# Patient Record
Sex: Male | Born: 1975 | Race: Black or African American | Hispanic: No | Marital: Single | Smoking: Former smoker
Health system: Southern US, Community
[De-identification: ages and names within clinical notes are randomized; demographics above are authoritative.]

## PROBLEM LIST (undated history)

## (undated) DIAGNOSIS — I10 Essential (primary) hypertension: Secondary | ICD-10-CM

## (undated) DIAGNOSIS — R600 Localized edema: Secondary | ICD-10-CM

## (undated) DIAGNOSIS — I8390 Asymptomatic varicose veins of unspecified lower extremity: Secondary | ICD-10-CM

## (undated) HISTORY — DX: Localized edema: R60.0

## (undated) HISTORY — DX: Asymptomatic varicose veins of unspecified lower extremity: I83.90

## (undated) HISTORY — PX: LEG SURGERY: SHX1003

---

## 2014-03-17 ENCOUNTER — Emergency Department (HOSPITAL_BASED_OUTPATIENT_CLINIC_OR_DEPARTMENT_OTHER)
Admission: EM | Admit: 2014-03-17 | Discharge: 2014-03-17 | Disposition: A | Discharge status: Home / Self Care | Payer: BC Managed Care – PPO | Attending: Emergency Medicine | Admitting: Emergency Medicine

## 2014-03-17 ENCOUNTER — Encounter (HOSPITAL_BASED_OUTPATIENT_CLINIC_OR_DEPARTMENT_OTHER): Payer: Self-pay | Admitting: Emergency Medicine

## 2014-03-17 DIAGNOSIS — Y9289 Other specified places as the place of occurrence of the external cause: Secondary | ICD-10-CM | POA: Insufficient documentation

## 2014-03-17 DIAGNOSIS — I10 Essential (primary) hypertension: Secondary | ICD-10-CM | POA: Insufficient documentation

## 2014-03-17 DIAGNOSIS — X58XXXA Exposure to other specified factors, initial encounter: Secondary | ICD-10-CM | POA: Insufficient documentation

## 2014-03-17 DIAGNOSIS — Y9389 Activity, other specified: Secondary | ICD-10-CM | POA: Diagnosis not present

## 2014-03-17 DIAGNOSIS — T783XXA Angioneurotic edema, initial encounter: Secondary | ICD-10-CM | POA: Diagnosis not present

## 2014-03-17 DIAGNOSIS — N289 Disorder of kidney and ureter, unspecified: Secondary | ICD-10-CM | POA: Diagnosis not present

## 2014-03-17 DIAGNOSIS — T7840XA Allergy, unspecified, initial encounter: Secondary | ICD-10-CM | POA: Diagnosis present

## 2014-03-17 DIAGNOSIS — F419 Anxiety disorder, unspecified: Secondary | ICD-10-CM | POA: Diagnosis not present

## 2014-03-17 LAB — CBC WITH DIFFERENTIAL/PLATELET
BASOS ABS: 0 10*3/uL (ref 0.0–0.1)
BASOS PCT: 0 % (ref 0–1)
EOS ABS: 0.2 10*3/uL (ref 0.0–0.7)
Eosinophils Relative: 2 % (ref 0–5)
HEMATOCRIT: 42.8 % (ref 39.0–52.0)
HEMOGLOBIN: 15.4 g/dL (ref 13.0–17.0)
Lymphocytes Relative: 27 % (ref 12–46)
Lymphs Abs: 2.4 10*3/uL (ref 0.7–4.0)
MCH: 29.1 pg (ref 26.0–34.0)
MCHC: 36 g/dL (ref 30.0–36.0)
MCV: 80.8 fL (ref 78.0–100.0)
Monocytes Absolute: 0.8 10*3/uL (ref 0.1–1.0)
Monocytes Relative: 9 % (ref 3–12)
NEUTROS ABS: 5.4 10*3/uL (ref 1.7–7.7)
Neutrophils Relative %: 62 % (ref 43–77)
Platelets: 192 10*3/uL (ref 150–400)
RBC: 5.3 MIL/uL (ref 4.22–5.81)
RDW: 13.9 % (ref 11.5–15.5)
WBC: 8.8 10*3/uL (ref 4.0–10.5)

## 2014-03-17 LAB — COMPREHENSIVE METABOLIC PANEL
ALBUMIN: 3.8 g/dL (ref 3.5–5.2)
ALT: 36 U/L (ref 0–53)
AST: 29 U/L (ref 0–37)
Alkaline Phosphatase: 122 U/L — ABNORMAL HIGH (ref 39–117)
Anion gap: 15 (ref 5–15)
BUN: 12 mg/dL (ref 6–23)
CO2: 26 mEq/L (ref 19–32)
CREATININE: 1.5 mg/dL — AB (ref 0.50–1.35)
Calcium: 9.6 mg/dL (ref 8.4–10.5)
Chloride: 101 mEq/L (ref 96–112)
GFR calc Af Amer: 66 mL/min — ABNORMAL LOW (ref >90)
GFR calc non Af Amer: 57 mL/min — ABNORMAL LOW (ref >90)
Glucose, Bld: 122 mg/dL — ABNORMAL HIGH (ref 70–99)
Potassium: 3.6 mEq/L — ABNORMAL LOW (ref 3.7–5.3)
Sodium: 142 mEq/L (ref 137–147)
TOTAL PROTEIN: 8.3 g/dL (ref 6.0–8.3)
Total Bilirubin: 0.5 mg/dL (ref 0.3–1.2)

## 2014-03-17 MED ORDER — METOPROLOL TARTRATE 50 MG PO TABS
50.0000 mg | ORAL_TABLET | Freq: Once | ORAL | Status: AC
  Administered 2014-03-17: 50 mg via ORAL
  Filled 2014-03-17: qty 1

## 2014-03-17 MED ORDER — HYDROCHLOROTHIAZIDE 25 MG PO TABS: 25.0000 mg | ORAL_TABLET | Freq: Once | ORAL | Status: DC

## 2014-03-17 MED ORDER — EPINEPHRINE HCL 1 MG/ML IJ SOLN
INTRAMUSCULAR | Status: AC
  Filled 2014-03-17: qty 1

## 2014-03-17 MED ORDER — METHYLPREDNISOLONE SODIUM SUCC 125 MG IJ SOLR
125.0000 mg | Freq: Once | INTRAMUSCULAR | Status: AC
  Administered 2014-03-17: 125 mg via INTRAVENOUS

## 2014-03-17 MED ORDER — ALBUTEROL SULFATE (2.5 MG/3ML) 0.083% IN NEBU
5.0000 mg | INHALATION_SOLUTION | Freq: Once | RESPIRATORY_TRACT | Status: AC
  Administered 2014-03-17: 5 mg via RESPIRATORY_TRACT
  Filled 2014-03-17: qty 6

## 2014-03-17 MED ORDER — EPINEPHRINE 0.3 MG/0.3ML IJ SOAJ: 0.3000 mg | Freq: Once | INTRAMUSCULAR | Status: AC

## 2014-03-17 MED ORDER — HYDROCHLOROTHIAZIDE 25 MG PO TABS
25.0000 mg | ORAL_TABLET | Freq: Once | ORAL | Status: AC
  Administered 2014-03-17: 25 mg via ORAL
  Filled 2014-03-17: qty 1

## 2014-03-17 MED ORDER — PREDNISONE 50 MG PO TABS: 50.0000 mg | ORAL_TABLET | Freq: Every day | ORAL | Status: DC

## 2014-03-17 MED ORDER — DIPHENHYDRAMINE HCL 50 MG/ML IJ SOLN
50.0000 mg | Freq: Once | INTRAMUSCULAR | Status: AC
  Administered 2014-03-17: 50 mg via INTRAVENOUS

## 2014-03-17 MED ORDER — METHYLPREDNISOLONE SODIUM SUCC 125 MG IJ SOLR
INTRAMUSCULAR | Status: AC
  Administered 2014-03-17: 125 mg via INTRAVENOUS
  Filled 2014-03-17: qty 2

## 2014-03-17 MED ORDER — DIPHENHYDRAMINE HCL 50 MG/ML IJ SOLN
INTRAMUSCULAR | Status: AC
  Administered 2014-03-17: 50 mg via INTRAVENOUS
  Filled 2014-03-17: qty 1

## 2014-03-17 MED ORDER — SODIUM CHLORIDE 0.9 % IV BOLUS (SEPSIS)
1000.0000 mL | Freq: Once | INTRAVENOUS | Status: AC
  Administered 2014-03-17: 1000 mL via INTRAVENOUS

## 2014-03-17 MED ORDER — HYDROCHLOROTHIAZIDE 25 MG PO TABS: 25.0000 mg | ORAL_TABLET | Freq: Every day | ORAL | Status: DC

## 2014-03-17 MED ORDER — FAMOTIDINE IN NACL 20-0.9 MG/50ML-% IV SOLN
INTRAVENOUS | Status: AC
  Administered 2014-03-17: 20 mg via INTRAVENOUS
  Filled 2014-03-17: qty 50

## 2014-03-17 MED ORDER — FAMOTIDINE IN NACL 20-0.9 MG/50ML-% IV SOLN
20.0000 mg | Freq: Once | INTRAVENOUS | Status: AC
  Administered 2014-03-17: 20 mg via INTRAVENOUS

## 2014-03-17 MED ORDER — METOPROLOL TARTRATE 50 MG PO TABS: 50.0000 mg | ORAL_TABLET | Freq: Two times a day (BID) | ORAL | Status: DC

## 2014-03-17 NOTE — ED Notes (Signed)
Pt placed on heart monitor.

## 2014-03-17 NOTE — ED Provider Notes (Signed)
CSN: [id number]     Arrival date & time 03/17/14  1546 History   First MD Initiated Contact with Patient 03/17/14 1559     Chief Complaint  Patient presents with  . Allergic Reaction     (Consider location/radiation/quality/duration/timing/severity/associated sxs/prior Treatment) The history is provided by the patient.  Stephen Humphrey is a 38 y.o. male here with possible angio edema. That him sandwich several hours ago and then about an hour later started having lip swelling and tongue swelling. Denies new food or new shampoos or new drugs. The patient is not currently on any medicines. No hx of angioedema.    No past medical history on file. No past surgical history on file. No family history on file. History  Substance Use Topics  . Smoking status: Never Smoker   . Smokeless tobacco: Not on file  . Alcohol Use: Not on file    Review of Systems  HENT:       Lip swelling   All other systems reviewed and are negative.     Allergies  Review of patient's allergies indicates no known allergies.  Home Medications   Prior to Admission medications   Not on File   BP 180/117 mmHg  Pulse 93  Temp(Src) 98.3 F (36.8 C) (Oral)  Resp 21  Ht 5\' 8"  (1.727 m)  Wt 230 lb (104.327 kg)  BMI 34.98 kg/m2  SpO2 96% Physical Exam  Constitutional: He is oriented to person, place, and time.  Tachypneic, anxious   HENT:  Head: Normocephalic.  R upper lip swollen. Tongue slightly swollen. Some soft palate swelling as well. Able to visualize posterior pharynx   Eyes: Conjunctivae are normal. Pupils are equal, round, and reactive to light.  Neck:  No stridor   Cardiovascular: Normal rate, regular rhythm and normal heart sounds.   Pulmonary/Chest: Effort normal and breath sounds normal.  Minimal wheezing throughout   Abdominal: Soft. Bowel sounds are normal. He exhibits no distension. There is no tenderness. There is no rebound and no guarding.  Musculoskeletal: Normal range of  motion. He exhibits no edema or tenderness.  Neurological: He is alert and oriented to person, place, and time. No cranial nerve deficit. Coordination normal.  Skin: Skin is warm and dry.  Psychiatric: He has a normal mood and affect. His behavior is normal. Judgment and thought content normal.  Nursing note and vitals reviewed.   ED Course  Procedures (including critical care time) Labs Review Labs Reviewed  COMPREHENSIVE METABOLIC PANEL - Abnormal; Notable for the following:    Potassium 3.6 (*)    Glucose, Bld 122 (*)    Creatinine, Ser 1.50 (*)    Alkaline Phosphatase 122 (*)    GFR calc non Af Amer 57 (*)    GFR calc Af Amer 66 (*)    All other components within normal limits  CBC WITH DIFFERENTIAL    Imaging Review No results found.   EKG Interpretation None      MDM   Final diagnoses:  None    Stephen SeamenRaymond Degregory is a 38 y.o. male here with angioedema. Given steroids, pepcid, benadryl in the ED. Observed for several hours. The swelling improved. No wheezing. BP elevated to 200/100. No chest pain or abdominal pain. Given metoprolol, HCTZ and BP improved to 180. No signs of hypertensive emergency. Will d/c home with BP meds. I told him to get BP rechecked.      Stephen Canalavid H Loyce Flaming, MD 03/17/14 [phone]

## 2014-03-17 NOTE — ED Notes (Signed)
Pt having allergic reaction to unknown substance.  Pt has swelling to lips, mouth and tongue.  Unable to visualize airway, but no stridor heard audibly.  Dr. Silverio Layyao notified to see the pt.

## 2014-03-17 NOTE — Discharge Instructions (Signed)
Take prednisone as prescribed.   Take benadryl 25 mg every 6 hrs as needed.   Take pepcid 20 mg daily.   Carry epi pen on you at all times. Give yourself a shot if you have trouble breathing, throat closing.   Take BP meds as prescribed.   You need to have your blood pressure rechecked in a week.   Your kidney function is slightly abnormal. Please see your doctor.   Return to ER if you have shortness of breath, headache, chest pain, abdominal pain, throat closing.    Emergency Department Resource Guide 1) Find a Doctor and Pay Out of Pocket Although you won't have to find out who is covered by your insurance plan, it is a good idea to ask around and get recommendations. You will then need to call the office and see if the doctor you have chosen will accept you as a new patient and what types of options they offer for patients who are self-pay. Some doctors offer discounts or will set up payment plans for their patients who do not have insurance, but you will need to ask so you aren't surprised when you get to your appointment.  2) Contact Your Local Health Department Not all health departments have doctors that can see patients for sick visits, but many do, so it is worth a call to see if yours does. If you don't know where your local health department is, you can check in your phone book. The CDC also has a tool to help you locate your state's health department, and many state websites also have listings of all of their local health departments.  3) Find a Walk-in Clinic If your illness is not likely to be very severe or complicated, you may want to try a walk in clinic. These are popping up all over the country in pharmacies, drugstores, and shopping centers. They're usually staffed by nurse practitioners or physician assistants that have been trained to treat common illnesses and complaints. They're usually fairly quick and inexpensive. However, if you have serious medical issues or  chronic medical problems, these are probably not your best option.  No Primary Care Doctor: - Call Health Connect at  [phone] - they can help you locate a primary care doctor that  accepts your insurance, provides certain services, etc. - Physician Referral Service- [phone]  Chronic Pain Problems: Organization         Address  Phone   Notes  [facility]  [phone] Patients need to be referred by their primary care doctor.   Medication Assistance: Organization         Address  Phone   Notes  [facility] [place] [place] Medication [facility] [address] [place], [place] [address] [phone] --Must be a resident of [place] -- Must have NO insurance coverage whatsoever (no Medicaid/ Medicare, etc.) -- The pt. MUST have a primary care doctor that directs their care regularly and follows them in the community   MedAssist  [phone]   [employer]  [phone]    Agencies that provide inexpensive medical care: Organization         Address  Phone   Notes  [facility]  [phone]   [facility]    [phone]) [phone]   [facility] [address] [place], [place] [address] [phone]   [facility] [address], [place] [phone]) [phone]  Planned Parenthood    [phone]   [facility]    [phone]   Community [facility] and [place] [address], [place] Phone:  [phone], Fax:  [phone] Hours of Operation:  9 am - 6 pm, M-F.  Also accepts Medicaid/Medicare and self-pay.  [facility] for [address] [address], [address], [place] Phone: [phone], Fax: [phone]. Hours of Operation:  8:30 am - 5:30 pm, M-F.  Also accepts Medicaid and self-pay.  [facility] [address], [place] Phone: [phone]   [place], [place], [place]  [phone], Ext. [phone] Mondays & Thursdays: 7-9 AM.  First 15 patients are seen on a first come, first serve basis.    [place] Providers:  Organization         Address  Phone   Notes  [facility] [address], [place] [phone] Also accepts self-pay patients.  [facility] [address], [place]  [phone]   [place], [address], [place] [phone]   [facility] [address], [place] [phone]   Lucianne Lei [address], [place]   [phone] Only accepts [place] Access [place] patients after they have their name applied to their card.   Self-Pay (no insurance) in [facility]:  Organization         Address  Phone   Notes  Sickle Cell Patients, [facility] [place] [phone]   [facility] [place] [phone]   [facility] [place]  [place], [place], [place] [phone]   [facility]/Dr. Osei-Bonsu  [address] [place] or [address], [place] [phone] Phone number for both [place] and [place] locations is the same.  [facility] [address] [place] [phone]   [facility] [address] [place] or [address] [phone]   [facility] [address] [place] [phone], phone; [phone], fax Sees patients 1st and 3rd Saturday of every month.  Must not qualify for public or private insurance (i.e. Medicaid, Medicare, [place] Health Choice, Veterans' Benefits)  Household income should be no more than 200% of the poverty level The clinic cannot treat you if you are pregnant or think you are pregnant  Sexually transmitted  diseases are not treated at the clinic.    Dental Care: Organization         Address  Phone  Notes  [facility] of [facility] [place] [phone] Accepts children up to age 25 who are enrolled in [place] or [place]; pregnant women with a Medicaid card; and children who have applied for Medicaid or [place] Health Choice, but were declined, whose parents can pay a reduced fee at time of service.  [facility] Department of [facility]  [address], [place] [phone] Accepts children up to age 96 who are enrolled in [place] or [place]; pregnant women with a Medicaid card; and children who have applied for Medicaid or [place], but were declined,  whose parents can pay a reduced fee at time of service.  [facility]  [place] [phone] Patients are seen by appointment only. Walk-ins are not accepted. [place] will see patients 2 years of age and older. Monday - Tuesday (8am-5pm) Most Wednesdays (8:30-5pm) $30 per visit, cash only  [facility] Adult [facility] Access PROGRAM  [address], [facility] [phone] Patients are seen by appointment only. Walk-ins are not accepted. [place] will see patients 41 years of age and older. One Wednesday Evening (Monthly: Volunteer Based).  $30 per visit, cash only  [place]  [phone] for adults; Children under age 26, call Graduate Pediatric Dentistry at [phone]. Children aged 20-14, please call [phone] to request a pediatric application.  Dental services are provided in all areas of dental care including fillings, crowns and bridges, complete and partial dentures, implants, gum treatment, root canals, and extractions. Preventive care is also provided. Treatment is provided to both adults and children. Patients are selected via a  lottery and there is often a waiting list.   [facility] [address] [place]  [phone] [email or web address]   [facility] [address] [place], [place] [phone], Ext. [phone] Second and Fourth Thursday of each month, opens at 6:30 AM; Clinic ends at 9 AM.  Patients are seen on a first-come first-served basis, and a limited number are seen during each clinic.   [facility]  [address] Hillard Danker [place], [place] [phone]   Eligibility Requirements You must have lived in [place], [place], or [place] counties for at least the last three months.   You cannot be eligible for state or federal sponsored [employer], including [employer], [place], or [employer].   You generally cannot be eligible for healthcare insurance through your employer.    How to apply: Eligibility screenings are held every Tuesday and Wednesday afternoon from 1:00 pm until 4:00 pm. You do not need an appointment for the interview!  [facility] [address] [place], [place]   [place]  [place] Department  [place]  [phone]    Behavioral Health Resources in the Community: Intensive Outpatient Programs Organization         Address  Phone  Notes  [facility] [place]. [address] [place], [place] [phone]   [facility] [address] [place], [place]   ADS: Alcohol & Drug Svcs [address], [place], [place]   [place] [address]  [place], [place] or [phone]   Substance Abuse Resources Organization         Address  Phone  Notes  Alcohol and Drug Services  [phone]   [place]  [phone]   The [place]   Chinita Pester  [phone]   Residential &  Outpatient Substance Abuse Program  [phone]   Psychological Services Organization         Address  Phone  Notes  [facility]  [place]  [phone]   [place] [address], [place] or [phone]    Mobile Crisis Teams Organization         Address  Phone  Notes  Therapeutic Alternatives, Mobile  Crisis Care Unit  [phone]   Assertive Psychotherapeutic Services  [address] [place], [place]   [facility] [address] [place] [phone]    Self-Help/Support Groups Organization         Address  Phone             Notes  [place]. of [place] - variety of support groups  [place] Call for more information  Narcotics Anonymous (NA), [facility] [address], [employer] [place]  2 meetings at this location   Special educational needs teacher         Address  Phone  Notes  ASAP Residential Treatment [place],    [place]  [phone]   [facility]  [address] [place] [address]056, [place], [place]   [place], [place] [phone] Admissions: 8am-3pm M-F  Incentives Substance [place] [address],    [place], [place] [phone]   [facility] [address] [place], [place], [place]   [facility] [address],  [place], [place]   [facility]ight Programs - Intensive Outpatient [address], [place], [place]   [facility] [place] ([place] [address]  [place], [place] [phone] or [phone]   [facility] (RTS) [address], [place], [place] Accepts Medicaid  [facility] [place] [address],  [place] [phone] Substance Abuse/Addiction Treatment   [facility] Organization          Address  Phone  Notes  [facility]  [phone]   Domenic Schwab, PhD [address] Arlis Porta [place], [place]   [phone] or [phone]   [place] Brin[place], [place] [phone]   [facility] [address] [place] [employer], [place] [phone] Insurance/Medicaid/sponsorship through [facility] and Families [address] [place]                                    [place], [place] [phone] [place] [address], [place] [phone]    Dr. Adele Schilder  [phone]   [facility] Dept. 1) [address], [place] 2) [place] 3)  [place] [address], [place] [phone]  [phone]   [place] [phone] or [phone] (After Hours)

## 2014-12-10 ENCOUNTER — Emergency Department (HOSPITAL_BASED_OUTPATIENT_CLINIC_OR_DEPARTMENT_OTHER): Payer: BLUE CROSS/BLUE SHIELD

## 2014-12-10 ENCOUNTER — Emergency Department (HOSPITAL_BASED_OUTPATIENT_CLINIC_OR_DEPARTMENT_OTHER)
Admission: EM | Admit: 2014-12-10 | Discharge: 2014-12-11 | Disposition: A | Discharge status: Home / Self Care | Payer: BLUE CROSS/BLUE SHIELD | Attending: Emergency Medicine | Admitting: Emergency Medicine

## 2014-12-10 ENCOUNTER — Encounter (HOSPITAL_BASED_OUTPATIENT_CLINIC_OR_DEPARTMENT_OTHER): Payer: Self-pay | Admitting: Emergency Medicine

## 2014-12-10 DIAGNOSIS — Z79899 Other long term (current) drug therapy: Secondary | ICD-10-CM | POA: Diagnosis not present

## 2014-12-10 DIAGNOSIS — M7989 Other specified soft tissue disorders: Secondary | ICD-10-CM

## 2014-12-10 DIAGNOSIS — I10 Essential (primary) hypertension: Secondary | ICD-10-CM

## 2014-12-10 DIAGNOSIS — Z7952 Long term (current) use of systemic steroids: Secondary | ICD-10-CM | POA: Diagnosis not present

## 2014-12-10 DIAGNOSIS — R220 Localized swelling, mass and lump, head: Secondary | ICD-10-CM | POA: Diagnosis present

## 2014-12-10 DIAGNOSIS — R600 Localized edema: Secondary | ICD-10-CM | POA: Diagnosis not present

## 2014-12-10 HISTORY — DX: Essential (primary) hypertension: I10

## 2014-12-10 LAB — CBC WITH DIFFERENTIAL/PLATELET
BASOS ABS: 0.1 10*3/uL (ref 0.0–0.1)
Basophils Relative: 1 % (ref 0–1)
EOS ABS: 0.3 10*3/uL (ref 0.0–0.7)
EOS PCT: 4 % (ref 0–5)
HEMATOCRIT: 40.8 % (ref 39.0–52.0)
Hemoglobin: 14.3 g/dL (ref 13.0–17.0)
LYMPHS PCT: 28 % (ref 12–46)
Lymphs Abs: 2.2 10*3/uL (ref 0.7–4.0)
MCH: 28.1 pg (ref 26.0–34.0)
MCHC: 35 g/dL (ref 30.0–36.0)
MCV: 80.3 fL (ref 78.0–100.0)
MONO ABS: 0.7 10*3/uL (ref 0.1–1.0)
Monocytes Relative: 8 % (ref 3–12)
NEUTROS ABS: 4.6 10*3/uL (ref 1.7–7.7)
NEUTROS PCT: 59 % (ref 43–77)
Platelets: 215 10*3/uL (ref 150–400)
RBC: 5.08 MIL/uL (ref 4.22–5.81)
RDW: 14.2 % (ref 11.5–15.5)
WBC: 7.8 10*3/uL (ref 4.0–10.5)

## 2014-12-10 LAB — BASIC METABOLIC PANEL
ANION GAP: 10 (ref 5–15)
BUN: 15 mg/dL (ref 6–20)
CO2: 26 mmol/L (ref 22–32)
Calcium: 8.9 mg/dL (ref 8.9–10.3)
Chloride: 104 mmol/L (ref 101–111)
Creatinine, Ser: 1.32 mg/dL — ABNORMAL HIGH (ref 0.61–1.24)
GFR calc Af Amer: >60 mL/min (ref >60)
GFR calc non Af Amer: >60 mL/min (ref >60)
GLUCOSE: 128 mg/dL — AB (ref 65–99)
POTASSIUM: 2.7 mmol/L — AB (ref 3.5–5.1)
Sodium: 140 mmol/L (ref 135–145)

## 2014-12-10 MED ORDER — HYDROCHLOROTHIAZIDE 25 MG PO TABS: 25.0000 mg | ORAL_TABLET | Freq: Every day | ORAL | Status: AC

## 2014-12-10 MED ORDER — FUROSEMIDE 10 MG/ML IJ SOLN
40.0000 mg | Freq: Once | INTRAMUSCULAR | Status: AC
  Administered 2014-12-10: 40 mg via INTRAVENOUS
  Filled 2014-12-10: qty 4

## 2014-12-10 MED ORDER — METOPROLOL TARTRATE 50 MG PO TABS: 50.0000 mg | ORAL_TABLET | Freq: Two times a day (BID) | ORAL | Status: AC

## 2014-12-10 MED ORDER — METOPROLOL TARTRATE 50 MG PO TABS
25.0000 mg | ORAL_TABLET | Freq: Once | ORAL | Status: AC
  Administered 2014-12-10: 25 mg via ORAL
  Filled 2014-12-10: qty 1

## 2014-12-10 MED ORDER — POTASSIUM CHLORIDE CRYS ER 20 MEQ PO TBCR: 20.0000 meq | EXTENDED_RELEASE_TABLET | Freq: Two times a day (BID) | ORAL | Status: AC

## 2014-12-10 MED ORDER — POTASSIUM CHLORIDE CRYS ER 20 MEQ PO TBCR
40.0000 meq | EXTENDED_RELEASE_TABLET | Freq: Once | ORAL | Status: AC
  Administered 2014-12-10: 40 meq via ORAL
  Filled 2014-12-10: qty 2

## 2014-12-10 NOTE — ED Notes (Signed)
EDP notified that pt's potassium is 2.7.

## 2014-12-10 NOTE — Discharge Instructions (Signed)
Edema °Edema is an abnormal buildup of fluids in your body tissues. Edema is somewhat dependent on gravity to pull the fluid to the lowest place in your body. That makes the condition more common in the legs and thighs (lower extremities). Painless swelling of the feet and ankles is common and becomes more likely as you get older. It is also common in looser tissues, like around your eyes.  °When the affected area is squeezed, the fluid may move out of that spot and leave a dent for a few moments. This dent is called pitting.  °CAUSES  °There are many possible causes of edema. Eating too much salt and being on your feet or sitting for a long time can cause edema in your legs and ankles. Hot weather may make edema worse. Common medical causes of edema include: °· Heart failure. °· Liver disease. °· Kidney disease. °· Weak blood vessels in your legs. °· Cancer. °· An injury. °· Pregnancy. °· Some medications. °· Obesity.  °SYMPTOMS  °Edema is usually painless. Your skin may look swollen or shiny.  °DIAGNOSIS  °Your health care provider may be able to diagnose edema by asking about your medical history and doing a physical exam. You may need to have tests such as X-rays, an electrocardiogram, or blood tests to check for medical conditions that may cause edema.  °TREATMENT  °Edema treatment depends on the cause. If you have heart, liver, or kidney disease, you need the treatment appropriate for these conditions. General treatment may include: °· Elevation of the affected body part above the level of your heart. °· Compression of the affected body part. Pressure from elastic bandages or support stockings squeezes the tissues and forces fluid back into the blood vessels. This keeps fluid from entering the tissues. °· Restriction of fluid and salt intake. °· Use of a water pill (diuretic). These medications are appropriate only for some types of edema. They pull fluid out of your body and make you urinate more often. This  gets rid of fluid and reduces swelling, but diuretics can have side effects. Only use diuretics as directed by your health care provider. °HOME CARE INSTRUCTIONS  °· Keep the affected body part above the level of your heart when you are lying down.   °· Do not sit still or stand for prolonged periods.   °· Do not put anything directly under your knees when lying down. °· Do not wear constricting clothing or garters on your upper legs.   °· Exercise your legs to work the fluid back into your blood vessels. This may help the swelling go down.   °· Wear elastic bandages or support stockings to reduce ankle swelling as directed by your health care provider.   °· Eat a low-salt diet to reduce fluid if your health care provider recommends it.   °· Only take medicines as directed by your health care provider.  °SEEK MEDICAL CARE IF:  °· Your edema is not responding to treatment. °· You have heart, liver, or kidney disease and notice symptoms of edema. °· You have edema in your legs that does not improve after elevating them.   °· You have sudden and unexplained weight gain. °SEEK IMMEDIATE MEDICAL CARE IF:  °· You develop shortness of breath or chest pain.   °· You cannot breathe when you lie down. °· You develop pain, redness, or warmth in the swollen areas.   °· You have heart, liver, or kidney disease and suddenly get edema. °· You have a fever and your symptoms suddenly get worse. °MAKE SURE YOU:  °·   Understand these instructions. °· Will watch your condition. °· Will get help right away if you are not doing well or get worse. °Document Released: 04/29/2005 Document Revised: 09/13/2013 Document Reviewed: 02/19/2013 °[employer]® Patient Information ©2015 [employer]. This information is not intended to replace advice given to you by your health care provider. Make sure you discuss any questions you have with your health care provider. °Hypertension °Hypertension, commonly called high blood pressure, is when the force of  blood pumping through your arteries is too strong. Your arteries are the blood vessels that carry blood from your heart throughout your body. A blood pressure reading consists of a higher number over a lower number, such as 110/72. The higher number (systolic) is the pressure inside your arteries when your heart pumps. The lower number (diastolic) is the pressure inside your arteries when your heart relaxes. Ideally you want your blood pressure below 120/80. °Hypertension forces your heart to work harder to pump blood. Your arteries may become narrow or stiff. Having hypertension puts you at risk for heart disease, stroke, and other problems.  °RISK FACTORS °Some risk factors for high blood pressure are controllable. Others are not.  °Risk factors you cannot control include:  °· Race. You may be at higher risk if you are African American. °· Age. Risk increases with age. °· Gender. Men are at higher risk than women before age 45 years. After age 65, women are at higher risk than men. °Risk factors you can control include: °· Not getting enough exercise or physical activity. °· Being overweight. °· Getting too much fat, sugar, calories, or salt in your diet. °· Drinking too much alcohol. °SIGNS AND SYMPTOMS °Hypertension does not usually cause signs or symptoms. Extremely high blood pressure (hypertensive crisis) may cause headache, anxiety, shortness of breath, and nosebleed. °DIAGNOSIS  °To check if you have hypertension, your health care provider will measure your blood pressure while you are seated, with your arm held at the level of your heart. It should be measured at least twice using the same arm. Certain conditions can cause a difference in blood pressure between your right and left arms. A blood pressure reading that is higher than normal on one occasion does not mean that you need treatment. If one blood pressure reading is high, ask your health care provider about having it checked again. °TREATMENT    °Treating high blood pressure includes making lifestyle changes and possibly taking medicine. Living a healthy lifestyle can help lower high blood pressure. You may need to change some of your habits. °Lifestyle changes may include: °· Following the DASH diet. This diet is high in fruits, vegetables, and whole grains. It is low in salt, red meat, and added sugars. °· Getting at least 2½ hours of brisk physical activity every week. °· Losing weight if necessary. °· Not smoking. °· Limiting alcoholic beverages. °· Learning ways to reduce stress. ° If lifestyle changes are not enough to get your blood pressure under control, your health care provider may prescribe medicine. You may need to take more than one. Work closely with your health care provider to understand the risks and benefits. °HOME CARE INSTRUCTIONS °· Have your blood pressure rechecked as directed by your health care provider.   °· Take medicines only as directed by your health care provider. Follow the directions carefully. Blood pressure medicines must be taken as prescribed. The medicine does not work as well when you skip doses. Skipping doses also puts you at risk for problems.   °·   Do not smoke.   Monitor your blood pressure at home as directed by your health care provider. SEEK MEDICAL CARE IF:   You think you are having a reaction to medicines taken.  You have recurrent headaches or feel dizzy.  You have swelling in your ankles.  You have trouble with your vision. SEEK IMMEDIATE MEDICAL CARE IF:  You develop a severe headache or confusion.  You have unusual weakness, numbness, or feel faint.  You have severe chest or abdominal pain.  You vomit repeatedly.  You have trouble breathing. MAKE SURE YOU:   Understand these instructions.  Will watch your condition.  Will get help right away if you are not doing well or get worse. Document Released: 04/29/2005 Document Revised: 09/13/2013 Document Reviewed:  02/19/2013 [facility] Patient Information 2015 [place], [place]. This information is not intended to replace advice given to you by your health care provider. Make sure you discuss any questions you have with your health care provider. Hypertension Hypertension, commonly called high blood pressure, is when the force of blood pumping through your arteries is too strong. Your arteries are the blood vessels that carry blood from your heart throughout your body. A blood pressure reading consists of a higher number over a lower number, such as 110/72. The higher number (systolic) is the pressure inside your arteries when your heart pumps. The lower number (diastolic) is the pressure inside your arteries when your heart relaxes. Ideally you want your blood pressure below 120/80. Hypertension forces your heart to work harder to pump blood. Your arteries may become narrow or stiff. Having hypertension puts you at risk for heart disease, stroke, and other problems.  RISK FACTORS Some risk factors for high blood pressure are controllable. Others are not.  Risk factors you cannot control include:   Race. You may be at higher risk if you are African American.  Age. Risk increases with age.  Gender. Men are at higher risk than women before age 40 years. After age 19, women are at higher risk than men. Risk factors you can control include:  Not getting enough exercise or physical activity.  Being overweight.  Getting too much fat, sugar, calories, or salt in your diet.  Drinking too much alcohol. SIGNS AND SYMPTOMS Hypertension does not usually cause signs or symptoms. Extremely high blood pressure (hypertensive crisis) may cause headache, anxiety, shortness of breath, and nosebleed. DIAGNOSIS  To check if you have hypertension, your health care provider will measure your blood pressure while you are seated, with your arm held at the level of your heart. It should be measured at least twice using the same  arm. Certain conditions can cause a difference in blood pressure between your right and left arms. A blood pressure reading that is higher than normal on one occasion does not mean that you need treatment. If one blood pressure reading is high, ask your health care provider about having it checked again. TREATMENT  Treating high blood pressure includes making lifestyle changes and possibly taking medicine. Living a healthy lifestyle can help lower high blood pressure. You may need to change some of your habits. Lifestyle changes may include:  Following the DASH diet. This diet is high in fruits, vegetables, and whole grains. It is low in salt, red meat, and added sugars.  Getting at least 2 hours of brisk physical activity every week.  Losing weight if necessary.  Not smoking.  Limiting alcoholic beverages.  Learning ways to reduce stress. If lifestyle changes are  not enough to get your blood pressure under control, your health care provider may prescribe medicine. You may need to take more than one. Work closely with your health care provider to understand the risks and benefits. HOME CARE INSTRUCTIONS  Have your blood pressure rechecked as directed by your health care provider.   Take medicines only as directed by your health care provider. Follow the directions carefully. Blood pressure medicines must be taken as prescribed. The medicine does not work as well when you skip doses. Skipping doses also puts you at risk for problems.   Do not smoke.   Monitor your blood pressure at home as directed by your health care provider. SEEK MEDICAL CARE IF:   You think you are having a reaction to medicines taken.  You have recurrent headaches or feel dizzy.  You have swelling in your ankles.  You have trouble with your vision. SEEK IMMEDIATE MEDICAL CARE IF:  You develop a severe headache or confusion.  You have unusual weakness, numbness, or feel faint.  You have severe chest  or abdominal pain.  You vomit repeatedly.  You have trouble breathing. MAKE SURE YOU:   Understand these instructions.  Will watch your condition.  Will get help right away if you are not doing well or get worse. Document Released: 04/29/2005 Document Revised: 09/13/2013 Document Reviewed: 02/19/2013 [facility] Patient Information 2015 [place], [place]. This information is not intended to replace advice given to you by your health care provider. Make sure you discuss any questions you have with your health care provider.

## 2014-12-10 NOTE — ED Provider Notes (Signed)
CSN: [id number]     Arrival date & time 12/10/14  1951 History   First MD Initiated Contact with Patient 12/10/14 2037     Chief Complaint  Patient presents with  . Leg Swelling     (Consider location/radiation/quality/duration/timing/severity/associated sxs/prior Treatment) Patient is a 39 y.o. male presenting with leg pain. The history is provided by the patient. No language interpreter was used.  Leg Pain Location:  Leg Injury: no   Leg location:  L leg and R leg Pain details:    Quality:  Aching   Radiates to:  Does not radiate   Severity:  Moderate   Onset quality:  Gradual   Timing:  Constant   Progression:  Worsening Chronicity:  New Dislocation: no   Foreign body present:  No foreign bodies Tetanus status:  Unknown Prior injury to area:  No Relieved by:  Nothing Worsened by:  Nothing tried Ineffective treatments:  None tried Associated symptoms: swelling   Risk factors: no concern for non-accidental trauma   Pt reports is leg area swollen left great than right.   Pt reports he has been decreasing his blood pressure medications.  Pt is not taking any now.  Past Medical History  Diagnosis Date  . Hypertension    History reviewed. No pertinent past surgical history. History reviewed. No pertinent family history. History  Substance Use Topics  . Smoking status: Never Smoker   . Smokeless tobacco: Not on file  . Alcohol Use: Yes     Comment: occ    Review of Systems  All other systems reviewed and are negative.     Allergies  Review of patient's allergies indicates no known allergies.  Home Medications   Prior to Admission medications   Medication Sig Start Date End Date Taking? Authorizing Provider  EPINEPHrine 0.3 mg/0.3 mL IJ SOAJ injection Inject 0.3 mLs (0.3 mg total) into the muscle once. 03/17/14   Richardean Canal, MD  hydrochlorothiazide (HYDRODIURIL) 25 MG tablet Take 1 tablet (25 mg total) by mouth daily. 03/17/14   Richardean Canal, MD  metoprolol  (LOPRESSOR) 50 MG tablet Take 1 tablet (50 mg total) by mouth 2 (two) times daily. 03/17/14   Richardean Canal, MD  predniSONE (DELTASONE) 50 MG tablet Take 1 tablet (50 mg total) by mouth daily. 03/17/14   Richardean Canal, MD   BP 152/87 mmHg  Pulse 92  Temp(Src) 98.4 F (36.9 C) (Oral)  Resp 16  Ht  (1.727 m)  Wt 267 lb (121.11 kg)  BMI 40.61 kg/m2  SpO2 98% Physical Exam  Constitutional: He is oriented to person, place, and time. He appears well-developed and well-nourished.  HENT:  Head: Normocephalic.  Eyes: EOM are normal.  Neck: Normal range of motion.  Pulmonary/Chest: Effort normal.  Abdominal: He exhibits no distension.  Musculoskeletal: Normal range of motion. He exhibits edema and tenderness.  Large amount of swelling left lower leg,  Small amount ot swelling right leg     Neurological: He is alert and oriented to person, place, and time.  Psychiatric: He has a normal mood and affect.  Nursing note and vitals reviewed.   ED Course  Procedures (including critical care time) Labs Review Labs Reviewed - No data to display  Imaging Review US Venous Img Lower Unilateral Left  12/10/2014   CLINICAL DATA:  Worsening left leg swelling and pain for 1 week.  EXAM: LEFT LOWER EXTREMITY VENOUS DOPPLER ULTRASOUND  TECHNIQUE: Gray-scale sonography with graded compression, as well  as color Doppler and duplex ultrasound were performed to evaluate the lower extremity deep venous systems from the level of the common femoral vein and including the common femoral, femoral, profunda femoral, popliteal and calf veins including the posterior tibial, peroneal and gastrocnemius veins when visible. The superficial great saphenous vein was also interrogated. Spectral Doppler was utilized to evaluate flow at rest and with distal augmentation maneuvers in the common femoral, femoral and popliteal veins.  COMPARISON:  None.  FINDINGS: Contralateral Common Femoral Vein: Respiratory phasicity is normal and  symmetric with the symptomatic side. No evidence of thrombus. Normal compressibility.  Common Femoral Vein: No evidence of thrombus. Normal compressibility, respiratory phasicity and response to augmentation.  Saphenofemoral Junction: No evidence of thrombus. Normal compressibility and flow on color Doppler imaging.  Profunda Femoral Vein: No evidence of thrombus. Normal compressibility and flow on color Doppler imaging.  Femoral Vein: No evidence of thrombus. Normal compressibility, respiratory phasicity and response to augmentation.  Popliteal Vein: No evidence of thrombus. Normal compressibility, respiratory phasicity and response to augmentation.  Calf Veins: No evidence of thrombus. Normal compressibility and flow on color Doppler imaging.  Superficial Great Saphenous Vein: No evidence of thrombus. Normal compressibility and flow on color Doppler imaging.  Venous Reflux:  None.  Other Findings:  None.  IMPRESSION: No evidence of deep venous thrombosis.   Electronically Signed   By: Myles Rosenthal M.D.   On: 12/10/2014 21:40     EKG Interpretation None      MDM  No dvt.  k is low pt given rx for k and his blood pressure medications.   Pt advised to see his MD for recheck   Final diagnoses:  Essential hypertension  Edema extremities       Elson Areas, PA-C 12/11/14 1329  Glynn Octave, MD 12/11/14 1336

## 2014-12-10 NOTE — ED Notes (Addendum)
Patient reports that he has had left leg swelling in his left leg and his right leg hurts. Patient reports that he has not taken any of his medications because he is weaning himself off to try to "fight it"

## 2015-12-13 ENCOUNTER — Encounter: Payer: Self-pay | Admitting: Vascular Surgery

## 2016-01-05 ENCOUNTER — Encounter: Payer: Self-pay | Admitting: Vascular Surgery

## 2016-01-09 ENCOUNTER — Encounter: Payer: BLUE CROSS/BLUE SHIELD | Admitting: Vascular Surgery

## 2016-02-02 ENCOUNTER — Encounter: Payer: Self-pay | Admitting: Vascular Surgery

## 2016-02-05 ENCOUNTER — Other Ambulatory Visit: Payer: Self-pay | Admitting: *Deleted

## 2016-02-05 DIAGNOSIS — I83812 Varicose veins of left lower extremities with pain: Secondary | ICD-10-CM

## 2016-02-07 ENCOUNTER — Encounter: Payer: Self-pay | Admitting: Vascular Surgery

## 2016-02-07 ENCOUNTER — Ambulatory Visit (HOSPITAL_COMMUNITY)
Admission: RE | Admit: 2016-02-07 | Discharge: 2016-02-07 | Disposition: A | Discharge status: Home / Self Care | Payer: 59 | Source: Ambulatory Visit | Attending: Vascular Surgery | Admitting: Vascular Surgery

## 2016-02-07 ENCOUNTER — Ambulatory Visit (INDEPENDENT_AMBULATORY_CARE_PROVIDER_SITE_OTHER): Payer: 59 | Admitting: Vascular Surgery

## 2016-02-07 VITALS — BP 143/98 | HR 88 | Temp 97.9°F | Resp 20 | Ht 68.0 in | Wt 299.0 lb

## 2016-02-07 DIAGNOSIS — R600 Localized edema: Secondary | ICD-10-CM | POA: Diagnosis present

## 2016-02-07 DIAGNOSIS — I83812 Varicose veins of left lower extremities with pain: Secondary | ICD-10-CM

## 2016-02-07 DIAGNOSIS — I872 Venous insufficiency (chronic) (peripheral): Secondary | ICD-10-CM | POA: Insufficient documentation

## 2016-02-07 NOTE — Progress Notes (Signed)
Patient name: Stephen Humphrey MRN: [id number] DOB: Apr 25, 1976 Sex: male  REASON FOR CONSULT: Varicose veins left leg. Referred by Burnett Kanaris, PA  HPI: Stephen Humphrey is a 40 y.o. male, who was referred for evaluation of varicose veins of the left leg. The patient describes a long history of left lower extremity swelling. He has minimal swelling problems on the right side. He has worn some knee-high compression stockings that he says are too tight. He does not think that these have helped his symptoms. His parents is aching pain and heaviness in his left leg which is associated with standing relieved with elevation. He works where he is on his feet for about 8 hours a day. He denies any previous history of DVT or phlebitis.  He denies any history of abdominal surgery or inguinal surgery or radiation therapy.  I have reviewed the records that were sent from the referring office. The patient had been seen with significant left leg swelling. This was worse during the day and improved at night.  Past Medical History:  Diagnosis Date  . Hypertension   . Leg edema, left   . Varicose veins     Family History  Problem Relation Age of Onset  . Hypertension Mother   . Cancer Maternal Aunt   . Cancer Maternal Grandfather     SOCIAL HISTORY: Social History   Social History  . Marital status: Single    Spouse name: N/A  . Number of children: N/A  . Years of education: N/A   Occupational History  . Not on file.   Social History Main Topics  . Smoking status: Former Games developer  . Smokeless tobacco: Former Neurosurgeon    Quit date: 02/07/2012  . Alcohol use Yes     Comment: occ  . Drug use: No  . Sexual activity: Not on file   Other Topics Concern  . Not on file   Social History Narrative  . No narrative on file    No Known Allergies  Current Outpatient Prescriptions  Medication Sig Dispense Refill  . furosemide (LASIX) 40 MG tablet Take 40 mg by mouth daily.    Marland Kitchen  losartan-hydrochlorothiazide (HYZAAR) 100-25 MG tablet Take 1 tablet by mouth daily.    . potassium chloride SA (K-DUR,KLOR-CON) 20 MEQ tablet Take 1 tablet (20 mEq total) by mouth 2 (two) times daily. 60 tablet 0  . amLODipine (NORVASC) 5 MG tablet Take 5 mg by mouth daily.    Marland Kitchen EPINEPHrine 0.3 mg/0.3 mL IJ SOAJ injection Inject 0.3 mLs (0.3 mg total) into the muscle once. 1 Device 0  . hydrochlorothiazide (HYDRODIURIL) 25 MG tablet Take 1 tablet (25 mg total) by mouth daily. (Patient not taking: Reported on 02/07/2016) 30 tablet 0  . metoprolol (LOPRESSOR) 50 MG tablet Take 1 tablet (50 mg total) by mouth 2 (two) times daily. (Patient not taking: Reported on 02/07/2016) 60 tablet 0  . predniSONE (DELTASONE) 50 MG tablet Take 1 tablet (50 mg total) by mouth daily. (Patient not taking: Reported on 02/07/2016) 5 tablet 0   No current facility-administered medications for this visit.     REVIEW OF SYSTEMS:  [X]  denotes positive finding, [ ]  denotes negative finding Cardiac  Comments:  Chest pain or chest pressure:    Shortness of breath upon exertion:    Short of breath when lying flat:    Irregular heart rhythm:        Vascular    Pain in calf, thigh, or hip brought  on by ambulation:    Pain in feet at night that wakes you up from your sleep:     Blood clot in your veins:    Leg swelling:  X Left leg       Pulmonary    Oxygen at home:    Productive cough:     Wheezing:         Neurologic    Sudden weakness in arms or legs:     Sudden numbness in arms or legs:     Sudden onset of difficulty speaking or slurred speech:    Temporary loss of vision in one eye:     Problems with dizziness:         Gastrointestinal    Blood in stool:     Vomited blood:         Genitourinary    Burning when urinating:     Blood in urine:        Psychiatric    Major depression:         Hematologic    Bleeding problems:    Problems with blood clotting too easily:        Skin    Rashes or  ulcers:        Constitutional    Fever or chills:      PHYSICAL EXAM: Vitals:   02/07/16 1413  BP: (!) 143/98  Pulse: 88  Resp: 20  Temp: 97.9 F (36.6 C)  TempSrc: Oral  SpO2: 92%  Weight: 299 lb (135.6 kg)  Height: 5\' 8"  (1.727 m)    GENERAL: The patient is a well-nourished male, in no acute distress. The vital signs are documented above. CARDIAC: There is a regular rate and rhythm.  VASCULAR: I do not detect carotid bruits. He has palpable femoral pulses and a palpable left dorsalis pedis pulse. I could not palpate pulses in the right foot however he has biphasic Doppler signals in the dorsalis pedis and posterior tibial positions on the right. On the left leg he has a biphasic posterior tibial signal. He has moderate left lower extremity swelling and sparing of the dorsum of the foot suggesting lymphedema. PULMONARY: There is good air exchange bilaterally without wheezing or rales. ABDOMEN: Soft and non-tender with normal pitched bowel sounds.  MUSCULOSKELETAL: There are no major deformities or cyanosis. NEUROLOGIC: No focal weakness or paresthesias are detected. SKIN: He does have a cluster of varicose veins on his posterior left calf. PSYCHIATRIC: The patient has a normal affect.  DATA:   LEFT LOWER EXTREMITY VENOUS DUPLEX STUDY: I have been apparently interpreted the duplex of the left lower extremity.  On the left side, there is no evidence of DVT or superficial thrombophlebitis. There is deep vein reflux involving the left common femoral vein. There is also reflux in the superficial system involving the saphenofemoral junction, left great saphenous vein in the thigh, and also the left small saphenous vein.  MEDICAL ISSUES:  CHRONIC VENOUS INSUFFICIENCY: Based on his duplex scan he does have evidence of deep vein reflux involving the left common femoral vein and the deep system and also superficial vein reflux in the left great saphenous vein into a more significant  degree in the small saphenous vein. We have discussed the importance of intermittent leg elevation of the proper positioning for this. I've also written him a prescription for compression stockings. I thought it would be worth trying him in a thigh-high stocking with a gradient of 20-30. If this were not  successful in relieving his symptoms that he could potentially be considered for laser ablation of the left small saphenous vein. I've encouraged him to avoid prolonged sitting and standing. I've encouraged him to stay as active as possible. We also discussed water aerobics which I think is helpful for people with venous disease. Given the distribution of the swelling he may also have some underlying lymphedema of the left lower extremity but I have explained that the treatment for this would be exactly the same. If his symptoms do not improve then he will call me to arrange for a follow up visit in 3 months with Dr. Hart Rochester or Dr. Arbie Cookey to discuss possible laser ablation of the left small saphenous vein.   Waverly Ferrari [facility] [phone]

## 2016-08-23 ENCOUNTER — Encounter (HOSPITAL_BASED_OUTPATIENT_CLINIC_OR_DEPARTMENT_OTHER): Payer: Self-pay | Admitting: *Deleted

## 2016-08-23 ENCOUNTER — Emergency Department (HOSPITAL_BASED_OUTPATIENT_CLINIC_OR_DEPARTMENT_OTHER)
Admission: EM | Admit: 2016-08-23 | Discharge: 2016-08-23 | Disposition: A | Discharge status: Home / Self Care | Payer: 59 | Attending: Emergency Medicine | Admitting: Emergency Medicine

## 2016-08-23 DIAGNOSIS — Z87891 Personal history of nicotine dependence: Secondary | ICD-10-CM | POA: Diagnosis not present

## 2016-08-23 DIAGNOSIS — I10 Essential (primary) hypertension: Secondary | ICD-10-CM | POA: Insufficient documentation

## 2016-08-23 DIAGNOSIS — M7989 Other specified soft tissue disorders: Secondary | ICD-10-CM | POA: Diagnosis not present

## 2016-08-23 NOTE — ED Provider Notes (Signed)
[facility] Provider Note   CSN: [id number] Arrival date & time: 08/23/16  1816  By signing my name below, I, Modena Jansky, attest that this documentation has been prepared under the direction and in the presence of non-physician practitioner, Glenford Bayley, PA-C. Electronically Signed: Modena Jansky, Scribe. 08/23/2016. 7:17 PM.  History   Chief Complaint Chief Complaint  Patient presents with  . Leg Swelling   The history is provided by the patient. No language interpreter was used.   HPI Comments: Stephen Humphrey is a 41 y.o. male with a PMHx of HTN and varicose veins and chronic venous insufficiency who presents to the Emergency Department complaining of constant moderate LLE swelling  that started about a year ago. He saw a vascular specialist in September 2017 for the same complaint and diagnosis with chronic venous insufficiency. He was prescribed compression stockings with plan to proceed with laser ablation and unsuccessful. He has not been wearing his prescribed compression stockings. He states he came to the ED today "to have fluid drained so he could play basketball". He has been taking  Lasix twice daily. Denies any hx of leg swelling, leg pain, cough, SOB, abdominal pain, nausea, vomiting, or other complaints at this time. Patient states his symptoms have not changed at all over the past year. He denies any pain. No new injury or trauma.     PCP: Burnis Medin, PA-C  Past Medical History:  Diagnosis Date  . Hypertension   . Leg edema, left   . Varicose veins     There are no active problems to display for this patient.   History reviewed. No pertinent surgical history.     Home Medications    Prior to Admission medications   Medication Sig Start Date End Date Taking? Authorizing Provider  amLODipine (NORVASC) 5 MG tablet Take 5 mg by mouth daily.    Historical Provider, MD  EPINEPHrine 0.3 mg/0.3 mL IJ SOAJ injection Inject 0.3 mLs (0.3 mg  total) into the muscle once. 03/17/14   Charlynne Pander, MD  furosemide (LASIX) 40 MG tablet Take 40 mg by mouth daily.    Historical Provider, MD  hydrochlorothiazide (HYDRODIURIL) 25 MG tablet Take 1 tablet (25 mg total) by mouth daily. Patient not taking: Reported on 02/07/2016 12/10/14   Elson Areas, PA-C  losartan-hydrochlorothiazide (HYZAAR) 100-25 MG tablet Take 1 tablet by mouth daily.    Historical Provider, MD  metoprolol (LOPRESSOR) 50 MG tablet Take 1 tablet (50 mg total) by mouth 2 (two) times daily. Patient not taking: Reported on 02/07/2016 12/10/14   Elson Areas, PA-C  potassium chloride SA (K-DUR,KLOR-CON) 20 MEQ tablet Take 1 tablet (20 mEq total) by mouth 2 (two) times daily. 12/10/14   Elson Areas, PA-C  predniSONE (DELTASONE) 50 MG tablet Take 1 tablet (50 mg total) by mouth daily. Patient not taking: Reported on 02/07/2016 03/17/14   Charlynne Pander, MD    Family History Family History  Problem Relation Age of Onset  . Hypertension Mother   . Cancer Maternal Aunt   . Cancer Maternal Grandfather     Social History Social History  Substance Use Topics  . Smoking status: Former Games developer  . Smokeless tobacco: Former Neurosurgeon    Quit date: 02/07/2012  . Alcohol use Yes     Comment: occ     Allergies   Patient has no known allergies.   Review of Systems Review of Systems  Constitutional: Negative for chills and fever.  HENT: Negative for facial swelling and sore throat.   Respiratory: Negative for cough and shortness of breath.   Cardiovascular: Positive for leg swelling (LLE). Negative for chest pain.  Gastrointestinal: Negative for abdominal pain, nausea and vomiting.  Genitourinary: Negative for dysuria.  Musculoskeletal: Negative for back pain.  Skin: Negative for rash and wound.  Neurological: Negative for headaches.  Psychiatric/Behavioral: The patient is not nervous/anxious.      Physical Exam Updated Vital Signs BP 131/88 (BP Location: Left  Arm)   Pulse 96   Temp 98.6 F (37 C) (Oral)   Resp 16   SpO2 97%   Physical Exam  Constitutional: He appears well-developed and well-nourished. No distress.  HENT:  Head: Normocephalic and atraumatic.  Mouth/Throat: Oropharynx is clear and moist. No oropharyngeal exudate.  Eyes: Conjunctivae are normal. Pupils are equal, round, and reactive to light. Right eye exhibits no discharge. Left eye exhibits no discharge. No scleral icterus.  Neck: Normal range of motion. Neck supple. No thyromegaly present.  Cardiovascular: Normal rate, regular rhythm, normal heart sounds and intact distal pulses.  Exam reveals no gallop and no friction rub.   No murmur heard. Pulmonary/Chest: Effort normal and breath sounds normal. No stridor. No respiratory distress. He has no wheezes. He has no rales.  Abdominal: Soft. Bowel sounds are normal. He exhibits no distension. There is no tenderness. There is no rebound and no guarding.  Musculoskeletal:  +2 pitting edema in the LLE; no calf tenderness or color change  Lymphadenopathy:    He has no cervical adenopathy.  Neurological: He is alert. Coordination normal.  Skin: Skin is warm and dry. No rash noted. He is not diaphoretic. No pallor.  Psychiatric: He has a normal mood and affect.  Nursing note and vitals reviewed.    ED Treatments / Results  DIAGNOSTIC STUDIES: Oxygen Saturation is 97% on RA, normal by my interpretation.    COORDINATION OF CARE: 7:21 PM- Pt advised of plan for treatment and pt agrees.  Labs (all labs ordered are listed, but only abnormal results are displayed) Labs Reviewed - No data to display  EKG  EKG Interpretation None       Radiology No results found.  Procedures Procedures (including critical care time)  Medications Ordered in ED Medications - No data to display   Initial Impression / Assessment and Plan / ED Course  I have reviewed the triage vital signs and the nursing notes.  Pertinent labs &  imaging results that were available during my care of the patient were reviewed by me and considered in my medical decision making (see chart for details).     Patient with stable, chronic left lower extremity edema. Patient has been diagnosed with chronic venous insufficiency in the past. Doubt DVT or other emergent condition at this time. He has not been wearing his compression stockings. I recommended beginning to wear compression stocking again with follow-up to his vascular specialist. Patient also advised to follow up with PCP for further evaluation and treatment. Strict return precautions given. Patient understands and agrees with plan. Patient vitals stable throughout ED course and discharged in satisfactory condition. I discussed patient case with Dr. Fredderick Phenix who guided the patient's management and agrees with plan.   Final Clinical Impressions(s) / ED Diagnoses   Final diagnoses:  Left leg swelling    New Prescriptions Discharge Medication List as of 08/23/2016  7:38 PM     I personally performed the services described in this documentation, which was scribed  in my presence. The recorded information has been reviewed and is accurate.     [address] PA-C 08/26/16 1610    Rolan Bucco, MD 09/03/16 1158

## 2016-08-23 NOTE — Discharge Instructions (Signed)
Begin wearing your compression stockings again. Elevate your leg whenever you're not walking on it. Please follow-up with your vascular doctor for further evaluation and treatment of your symptoms. Please follow-up with your primary care provider for your CPAP machine as we discussed. Please return to the emergency department or see her doctor immediately if you develop any pain, increasing swelling, difficulty breathing, chest pain, or any other new or concerning symptoms.

## 2016-08-23 NOTE — ED Triage Notes (Signed)
Pt c/o left leg swelling x 1 year

## 2018-07-17 ENCOUNTER — Other Ambulatory Visit: Payer: Self-pay

## 2018-07-17 ENCOUNTER — Encounter (HOSPITAL_BASED_OUTPATIENT_CLINIC_OR_DEPARTMENT_OTHER): Payer: Self-pay | Admitting: Emergency Medicine

## 2018-07-17 ENCOUNTER — Emergency Department (HOSPITAL_BASED_OUTPATIENT_CLINIC_OR_DEPARTMENT_OTHER)
Admission: EM | Admit: 2018-07-17 | Discharge: 2018-07-17 | Disposition: A | Discharge status: Home / Self Care | Payer: 59 | Attending: Emergency Medicine | Admitting: Emergency Medicine

## 2018-07-17 DIAGNOSIS — I10 Essential (primary) hypertension: Secondary | ICD-10-CM | POA: Diagnosis not present

## 2018-07-17 DIAGNOSIS — Z79899 Other long term (current) drug therapy: Secondary | ICD-10-CM | POA: Diagnosis not present

## 2018-07-17 DIAGNOSIS — M79606 Pain in leg, unspecified: Secondary | ICD-10-CM | POA: Diagnosis present

## 2018-07-17 DIAGNOSIS — M5432 Sciatica, left side: Secondary | ICD-10-CM | POA: Diagnosis not present

## 2018-07-17 DIAGNOSIS — I872 Venous insufficiency (chronic) (peripheral): Secondary | ICD-10-CM | POA: Insufficient documentation

## 2018-07-17 DIAGNOSIS — Z87891 Personal history of nicotine dependence: Secondary | ICD-10-CM | POA: Diagnosis not present

## 2018-07-17 MED ORDER — PREDNISONE 10 MG (21) PO TBPK: ORAL_TABLET | ORAL | 0 refills | Status: AC

## 2018-07-17 MED ORDER — OXYCODONE-ACETAMINOPHEN 5-325 MG PO TABS
2.0000 | ORAL_TABLET | Freq: Once | ORAL | Status: AC
  Administered 2018-07-17: 2 via ORAL
  Filled 2018-07-17: qty 2

## 2018-07-17 MED ORDER — IBUPROFEN 800 MG PO TABS: 800.0000 mg | ORAL_TABLET | Freq: Three times a day (TID) | ORAL | 0 refills | Status: DC | PRN

## 2018-07-17 MED ORDER — PREDNISONE 50 MG PO TABS
60.0000 mg | ORAL_TABLET | Freq: Once | ORAL | Status: AC
  Administered 2018-07-17: 60 mg via ORAL
  Filled 2018-07-17: qty 1

## 2018-07-17 MED ORDER — IBUPROFEN 800 MG PO TABS
800.0000 mg | ORAL_TABLET | Freq: Once | ORAL | Status: AC
  Administered 2018-07-17: 800 mg via ORAL
  Filled 2018-07-17: qty 1

## 2018-07-17 MED ORDER — OXYCODONE-ACETAMINOPHEN 5-325 MG PO TABS: 2.0000 | ORAL_TABLET | Freq: Four times a day (QID) | ORAL | 0 refills | Status: AC | PRN

## 2018-07-17 MED ORDER — IBUPROFEN 800 MG PO TABS: 800.0000 mg | ORAL_TABLET | Freq: Three times a day (TID) | ORAL | 0 refills | Status: AC | PRN

## 2018-07-17 MED ORDER — PREDNISONE 10 MG (21) PO TBPK: ORAL_TABLET | ORAL | 0 refills | Status: DC

## 2018-07-17 NOTE — ED Triage Notes (Signed)
Pt c/o 10/10 left leg pain since las night unable to sleep. Left leg swollen, pt denies any injury.

## 2018-07-17 NOTE — ED Provider Notes (Signed)
TIME SEEN: 2:41 AM  CHIEF COMPLAINT: Back pain, left buttock pain  HPI: Patient is a 43 year old male with history of hypertension, chronic venous insufficiency with chronic left lower extremity swelling who presents to the emergency department with lower back pain that radiates into the left buttock.  Pain worse with ambulation.  Has tried muscle relaxers at home without relief.  No injury that he can recall.  No numbness, tingling or focal weakness.  No bowel or bladder incontinence.  No fever.  No history of back surgeries or epidural injection.  Patient is not a diabetic or immunocompromise.  Does not have history of IV drug abuse.  No pain or increased swelling in the left lower extremity.  No chest pain or shortness of breath.  ROS: See HPI Constitutional: no fever  Eyes: no drainage  ENT: no runny nose   Cardiovascular:  no chest pain  Resp: no SOB  GI: no vomiting GU: no dysuria Integumentary: no rash  Allergy: no hives  Musculoskeletal: Chronic, unchanged bilateral leg swelling worse in the left leg Neurological: no slurred speech ROS otherwise negative  PAST MEDICAL HISTORY/PAST SURGICAL HISTORY:  Past Medical History:  Diagnosis Date  . Hypertension   . Leg edema, left   . Varicose veins     MEDICATIONS:  Prior to Admission medications   Medication Sig Start Date End Date Taking? Authorizing Provider  amLODipine (NORVASC) 5 MG tablet Take 5 mg by mouth daily.    [provider]  EPINEPHrine 0.3 mg/0.3 mL IJ SOAJ injection Inject 0.3 mLs (0.3 mg total) into the muscle once. 03/17/14   Charlynne Pander, MD  furosemide (LASIX) 40 MG tablet Take 40 mg by mouth daily.    [provider]  hydrochlorothiazide (HYDRODIURIL) 25 MG tablet Take 1 tablet (25 mg total) by mouth daily. Patient not taking: Reported on 02/07/2016 12/10/14   Elson Areas, PA-C  losartan-hydrochlorothiazide (HYZAAR) 100-25 MG tablet Take 1 tablet by mouth daily.    [provider]  metoprolol (LOPRESSOR) 50 MG tablet Take 1 tablet (50 mg total) by mouth 2 (two) times daily. Patient not taking: Reported on 02/07/2016 12/10/14   Elson Areas, PA-C  potassium chloride SA (K-DUR,KLOR-CON) 20 MEQ tablet Take 1 tablet (20 mEq total) by mouth 2 (two) times daily. 12/10/14   Elson Areas, PA-C  predniSONE (DELTASONE) 50 MG tablet Take 1 tablet (50 mg total) by mouth daily. Patient not taking: Reported on 02/07/2016 03/17/14   Charlynne Pander, MD    ALLERGIES:  No Known Allergies  SOCIAL HISTORY:  Social History   Tobacco Use  . Smoking status: Former Games developer  . Smokeless tobacco: Former Neurosurgeon    Quit date: 02/07/2012  Substance Use Topics  . Alcohol use: Yes    Comment: occ    FAMILY HISTORY: Family History  Problem Relation Age of Onset  . Hypertension Mother   . Cancer Maternal Aunt   . Cancer Maternal Grandfather     EXAM: BP 129/76 (BP Location: Right Arm)   Pulse 91   Temp 98 F (36.7 C) (Oral)   Resp 18   Ht 5\' 11"  (1.803 m)   Wt (!) 156.9 kg   SpO2 97%   BMI 48.26 kg/m  CONSTITUTIONAL: Alert and oriented and responds appropriately to questions.  Obese, patient appears uncomfortable, difficult for him to lay flat or sit upright from a lying position HEAD: Normocephalic EYES: Conjunctivae clear, pupils appear equal, EOMI ENT: normal nose;  moist mucous membranes NECK: Supple, no meningismus, no nuchal rigidity, no LAD  CARD: RRR; S1 and S2 appreciated; no murmurs, no clicks, no rubs, no gallops RESP: Normal chest excursion without splinting or tachypnea; breath sounds clear and equal bilaterally; no wheezes, no rhonchi, no rales, no hypoxia or respiratory distress, speaking full sentences ABD/GI: Normal bowel sounds; non-distended; soft, non-tender, no rebound, no guarding, no peritoneal signs, no hepatosplenomegaly BACK:  The back appears normal and is non-tender to palpation, there is no CVA tenderness, no midline spinal  tenderness or step-off or deformity, patient is tender to palpation over the left posterior buttock EXT: Normal ROM in all joints; non-tender to palpation; chronic edema that is nonpitting in bilateral lower extremities worse in the left leg than the right without erythema or warmth; normal capillary refill; no cyanosis, no calf tenderness on exam, 2+ palpable left DP pulse, compartments soft, no joint effusion noted SKIN: Normal color for age and race; warm; no rash NEURO: Moves all extremities equally, normal sensation diffusely, no saddle anesthesia, pain with ambulation PSYCH: The patient's mood and manner are appropriate. Grooming and personal hygiene are appropriate.  MEDICAL DECISION MAKING: Patient here with what appears to be sciatica.  Pain radiates from the lower back into the buttock.  Could be secondary to piriformis syndrome.  No injury to suggest fracture.  He has no bony tenderness of the left leg.  He is chronic venous insufficiency with chronic, unchanged nonpitting edema in both lower extremities, worse on the left side.  No signs of cellulitis, compartment syndrome, gout, septic arthritis.  Doubt DVT or arterial obstruction.  Has taken muscle relaxers without relief.  Will give Percocet, ibuprofen, prednisone.  No red flag symptoms to suggest cauda equina, epidural abscess or hematoma, discitis or osteomyelitis, transverse myelitis.  I do not feel he needs emergent imaging of his back.  ED PROGRESS: Patient reports feeling much better.  Will discharge with prescriptions of Percocet, ibuprofen, prednisone.  Recommended close follow-up with his primary care physician as he may benefit from physical therapy, weight loss.  I do not feel he needs emergent MRI at this time but may benefit from 1 of his an outpatient if symptomatic medical management is not improving his symptoms.  At this time, I do not feel there is any life-threatening condition present. I have reviewed and discussed all  results (EKG, imaging, lab, urine as appropriate) and exam findings with patient/family. I have reviewed nursing notes and appropriate previous records.  I feel the patient is safe to be discharged home without further emergent workup and can continue workup as an outpatient as needed. Discussed usual and customary return precautions. Patient/family verbalize understanding and are comfortable with this plan.  Outpatient follow-up has been provided as needed. All questions have been answered.      Gracelynn Bircher, Layla Maw, DO 07/17/18 [phone]

## 2020-08-20 ENCOUNTER — Emergency Department (HOSPITAL_BASED_OUTPATIENT_CLINIC_OR_DEPARTMENT_OTHER): Payer: BC Managed Care – PPO

## 2020-08-20 ENCOUNTER — Other Ambulatory Visit: Payer: Self-pay

## 2020-08-20 ENCOUNTER — Encounter (HOSPITAL_BASED_OUTPATIENT_CLINIC_OR_DEPARTMENT_OTHER): Payer: Self-pay | Admitting: Emergency Medicine

## 2020-08-20 ENCOUNTER — Emergency Department (HOSPITAL_BASED_OUTPATIENT_CLINIC_OR_DEPARTMENT_OTHER)
Admission: EM | Admit: 2020-08-20 | Discharge: 2020-08-20 | Disposition: A | Discharge status: Home / Self Care | Payer: BC Managed Care – PPO | Attending: Emergency Medicine | Admitting: Emergency Medicine

## 2020-08-20 DIAGNOSIS — Z79899 Other long term (current) drug therapy: Secondary | ICD-10-CM | POA: Diagnosis not present

## 2020-08-20 DIAGNOSIS — Z87891 Personal history of nicotine dependence: Secondary | ICD-10-CM | POA: Insufficient documentation

## 2020-08-20 DIAGNOSIS — R2241 Localized swelling, mass and lump, right lower limb: Secondary | ICD-10-CM | POA: Diagnosis present

## 2020-08-20 DIAGNOSIS — I10 Essential (primary) hypertension: Secondary | ICD-10-CM | POA: Diagnosis not present

## 2020-08-20 DIAGNOSIS — L03115 Cellulitis of right lower limb: Secondary | ICD-10-CM | POA: Diagnosis not present

## 2020-08-20 LAB — LACTIC ACID, PLASMA: Lactic Acid, Venous: 0.9 mmol/L (ref 0.5–1.9)

## 2020-08-20 LAB — COMPREHENSIVE METABOLIC PANEL
ALT: 22 U/L (ref 0–44)
AST: 22 U/L (ref 15–41)
Albumin: 3.5 g/dL (ref 3.5–5.0)
Alkaline Phosphatase: 85 U/L (ref 38–126)
Anion gap: 10 (ref 5–15)
BUN: 10 mg/dL (ref 6–20)
CO2: 27 mmol/L (ref 22–32)
Calcium: 8.7 mg/dL — ABNORMAL LOW (ref 8.9–10.3)
Chloride: 100 mmol/L (ref 98–111)
Creatinine, Ser: 1.21 mg/dL (ref 0.61–1.24)
GFR, Estimated: >60 mL/min (ref >60)
Glucose, Bld: 91 mg/dL (ref 70–99)
Potassium: 3.3 mmol/L — ABNORMAL LOW (ref 3.5–5.1)
Sodium: 137 mmol/L (ref 135–145)
Total Bilirubin: 1.4 mg/dL — ABNORMAL HIGH (ref 0.3–1.2)
Total Protein: 7.7 g/dL (ref 6.5–8.1)

## 2020-08-20 LAB — CBC WITH DIFFERENTIAL/PLATELET
Abs Immature Granulocytes: 0.02 10*3/uL (ref 0.00–0.07)
Basophils Absolute: 0 10*3/uL (ref 0.0–0.1)
Basophils Relative: 1 %
Eosinophils Absolute: 0.1 10*3/uL (ref 0.0–0.5)
Eosinophils Relative: 3 %
HCT: 53 % — ABNORMAL HIGH (ref 39.0–52.0)
Hemoglobin: 17.6 g/dL — ABNORMAL HIGH (ref 13.0–17.0)
Immature Granulocytes: 0 %
Lymphocytes Relative: 27 %
Lymphs Abs: 1.3 10*3/uL (ref 0.7–4.0)
MCH: 28.8 pg (ref 26.0–34.0)
MCHC: 33.2 g/dL (ref 30.0–36.0)
MCV: 86.7 fL (ref 80.0–100.0)
Monocytes Absolute: 0.6 10*3/uL (ref 0.1–1.0)
Monocytes Relative: 13 %
Neutro Abs: 2.7 10*3/uL (ref 1.7–7.7)
Neutrophils Relative %: 56 %
Platelets: 215 10*3/uL (ref 150–400)
RBC: 6.11 MIL/uL — ABNORMAL HIGH (ref 4.22–5.81)
RDW: 13.9 % (ref 11.5–15.5)
WBC: 4.7 10*3/uL (ref 4.0–10.5)
nRBC: 0 % (ref 0.0–0.2)

## 2020-08-20 LAB — CK: Total CK: 413 U/L — ABNORMAL HIGH (ref 49–397)

## 2020-08-20 MED ORDER — CEPHALEXIN 500 MG PO CAPS: 500.0000 mg | ORAL_CAPSULE | Freq: Four times a day (QID) | ORAL | 0 refills | Status: AC

## 2020-08-20 MED ORDER — POTASSIUM CHLORIDE CRYS ER 20 MEQ PO TBCR
40.0000 meq | EXTENDED_RELEASE_TABLET | Freq: Once | ORAL | Status: AC
  Administered 2020-08-20: 40 meq via ORAL
  Filled 2020-08-20: qty 2

## 2020-08-20 NOTE — ED Triage Notes (Signed)
Pt arrives pov with c/o right leg pain and swelling x 1 week. Pt denies injury, swelling noted. Pt endorses receiving wound care on left leg r/t unhealed wound d/t MRSA. Pt denies fever, denies shob

## 2020-08-20 NOTE — Discharge Instructions (Signed)
You are seen today for cellulitis, please take the antibiotics as directed.  If you have any new worsening concerning symptom please back to the emergency department.  Please use the attached instructions.  Please have a follow-up in the next 2 days for a checkup.  Schedule this with your primary care.  Please speak to pharmacist about any new medications prescribed today in regards to side effects or interactions with other medications.  Your blood pressure was also slightly elevated today in the ER, please have this rechecked with her PCP as well.  Get help right away if: Your symptoms get worse. You feel very sleepy. You throw up (vomit) or have watery poop (diarrhea) for a long time. You see red streaks coming from the area. Your red area gets larger. Your red area turns dark in color.

## 2020-08-20 NOTE — ED Provider Notes (Signed)
[facility] EMERGENCY DEPARTMENT Provider Note   CSN: [id number] Arrival date & time: 08/20/20  1520     History Chief Complaint  Patient presents with  . Leg Swelling    Stephen Humphrey is a 45 y.o. male with pertinent past medical history of hypertension, varicose veins and chronic left leg edema being followed by wound care the presents the emergency department today for right leg pain and swelling for the past week.  Patient states that he noticed this a week ago, difficult to ambulate due to pain and edema.  Patient states that it only hurts when he puts pressure on it.  Denies any fevers, chills nausea or vomiting.  States that he is not in pain right now while laying.  Swelling mainly around his ankle and foot.  Patient states that he has chronic foot wound care due to his left leg, is chronically swollen and has unhealed wounds.  Denies any shortness of breath, chest pain.  Not on any anticoagulants.  Denies any history of DVT, recent surgery recently travel.  HPI     Past Medical History:  Diagnosis Date  . Hypertension   . Leg edema, left   . Varicose veins     There are no problems to display for this patient.   Past Surgical History:  Procedure Laterality Date  . LEG SURGERY         Family History  Problem Relation Age of Onset  . Hypertension Mother   . Cancer Maternal Aunt   . Cancer Maternal Grandfather     Social History   Tobacco Use  . Smoking status: Former Games developermoker  . Smokeless tobacco: Former NeurosurgeonUser    Quit date: 02/07/2012  Substance Use Topics  . Alcohol use: Yes    Comment: occ  . Drug use: No    Home Medications Prior to Admission medications   Medication Sig Start Date End Date Taking? Authorizing Provider  atorvastatin (LIPITOR) 20 MG tablet Take 1 tablet by mouth daily. 03/30/20  Yes [provider]  cephALEXin (KEFLEX) 500 MG capsule Take 1 capsule (500 mg total) by mouth 4 (four) times daily. 08/20/20  Yes Renn Dirocco,  Netasha Wehrli, PA-C  gabapentin (NEURONTIN) 300 MG capsule Take by mouth. 08/18/20 11/16/20 Yes [provider]  hydrALAZINE (APRESOLINE) 50 MG tablet Take 1 tablet by mouth 3 (three) times daily. 07/13/20  Yes [provider]  losartan (COZAAR) 100 MG tablet Take by mouth daily. 12/26/16  Yes [provider]  amLODipine (NORVASC) 5 MG tablet Take 5 mg by mouth daily.    [provider]  EPINEPHrine 0.3 mg/0.3 mL IJ SOAJ injection Inject 0.3 mLs (0.3 mg total) into the muscle once. 03/17/14   Charlynne PanderYao, David Hsienta, MD  furosemide (LASIX) 40 MG tablet Take 40 mg by mouth daily.    [provider]  hydrochlorothiazide (HYDRODIURIL) 25 MG tablet Take 1 tablet (25 mg total) by mouth daily. Patient not taking: Reported on 02/07/2016 12/10/14   Elson AreasSofia, Leslie K, PA-C  ibuprofen (ADVIL,MOTRIN) 800 MG tablet Take 1 tablet (800 mg total) by mouth every 8 (eight) hours as needed for mild pain. 07/17/18   Ward, Layla MawKristen N, DO  losartan-hydrochlorothiazide (HYZAAR) 100-25 MG tablet Take 1 tablet by mouth daily.    [provider]  metoprolol (LOPRESSOR) 50 MG tablet Take 1 tablet (50 mg total) by mouth 2 (two) times daily. Patient not taking: Reported on 02/07/2016 12/10/14   Elson AreasSofia, Leslie K, PA-C  oxyCODONE-acetaminophen (PERCOCET/ROXICET) [phone]  MG tablet Take 2 tablets by mouth every 6 (six) hours as needed for severe pain. 07/17/18   Ward, Layla Maw, DO  potassium chloride SA (K-DUR,KLOR-CON) 20 MEQ tablet Take 1 tablet (20 mEq total) by mouth 2 (two) times daily. 12/10/14   Elson Areas, PA-C  predniSONE (STERAPRED UNI-PAK 21 TAB) 10 MG (21) TBPK tablet Take as directed 07/17/18   Ward, Layla Maw, DO    Allergies    Patient has no known allergies.  Review of Systems   Review of Systems  Constitutional: Negative for chills, diaphoresis, fatigue and fever.  HENT: Negative for congestion, sore throat and trouble swallowing.   Eyes: Negative for pain and visual disturbance.   Respiratory: Negative for cough, shortness of breath and wheezing.   Cardiovascular: Positive for leg swelling. Negative for chest pain and palpitations.  Gastrointestinal: Negative for abdominal distention, abdominal pain, diarrhea, nausea and vomiting.  Genitourinary: Negative for difficulty urinating.  Musculoskeletal: Positive for arthralgias. Negative for back pain, neck pain and neck stiffness.  Skin: Negative for pallor.  Neurological: Negative for dizziness, speech difficulty, weakness and headaches.  Psychiatric/Behavioral: Negative for confusion.    Physical Exam Updated Vital Signs BP (!) 157/99   Pulse 69   Temp 98.7 F (37.1 C) (Oral)   Resp 18   Ht 5\' 8"  (1.727 m)   Wt (!) 147 kg   SpO2 96%   BMI 49.26 kg/m   Physical Exam Constitutional:      General: He is not in acute distress.    Appearance: Normal appearance. He is not ill-appearing, toxic-appearing or diaphoretic.  HENT:     Mouth/Throat:     Mouth: Mucous membranes are moist.     Pharynx: Oropharynx is clear.  Eyes:     General: No scleral icterus.    Extraocular Movements: Extraocular movements intact.     Pupils: Pupils are equal, round, and reactive to light.  Cardiovascular:     Rate and Rhythm: Normal rate and regular rhythm.     Pulses: Normal pulses.     Heart sounds: Normal heart sounds.  Pulmonary:     Effort: Pulmonary effort is normal. No respiratory distress.     Breath sounds: Normal breath sounds. No stridor. No wheezing, rhonchi or rales.  Chest:     Chest wall: No tenderness.  Abdominal:     General: Abdomen is flat. There is no distension.     Palpations: Abdomen is soft.     Tenderness: There is no abdominal tenderness. There is no guarding or rebound.  Musculoskeletal:        General: No swelling or tenderness. Normal range of motion.     Cervical back: Normal range of motion and neck supple. No rigidity.     Right lower leg: Edema present.     Left lower leg: Edema  present.     Comments: Patient with right leg edema and some erythema and warmth to lower extremity.  Swelling primarily noted around the ankle and foot.  PT pulses 2+.  Normal leg raise, compartments are soft.  No pain to ankle or knee specifically, does have some tenderness to calf.  No weeping or lesions noted.  Normal range of motion to ankle and foot with normal strength and sensation throughout.  Normal gait.  Left leg with edema, does have bandages around it is from recent wound care visit.  Patient does not want me to unravel these.  States that he is not having any pain  or problems with this.   Skin:    General: Skin is warm and dry.     Capillary Refill: Capillary refill takes less than 2 seconds.     Coloration: Skin is not pale.  Neurological:     General: No focal deficit present.     Mental Status: He is alert and oriented to person, place, and time.  Psychiatric:        Mood and Affect: Mood normal.        Behavior: Behavior normal.     ED Results / Procedures / Treatments   Labs (all labs ordered are listed, but only abnormal results are displayed) Labs Reviewed  COMPREHENSIVE METABOLIC PANEL - Abnormal; Notable for the following components:      Result Value   Potassium 3.3 (*)    Calcium 8.7 (*)    Total Bilirubin 1.4 (*)    All other components within normal limits  CBC WITH DIFFERENTIAL/PLATELET - Abnormal; Notable for the following components:   RBC 6.11 (*)    Hemoglobin 17.6 (*)    HCT 53.0 (*)    All other components within normal limits  CK - Abnormal; Notable for the following components:   Total CK 413 (*)    All other components within normal limits  LACTIC ACID, PLASMA  LACTIC ACID, PLASMA    EKG None  Radiology US Venous Img Lower Right (DVT Study)  Result Date: 08/20/2020 CLINICAL DATA:  45 year old with a 1 week history of RIGHT lower extremity pain and swelling. Personal history of lower extremity lymphedema. EXAM: RIGHT LOWER EXTREMITY  VENOUS DOPPLER ULTRASOUND TECHNIQUE: Gray-scale sonography with graded compression, as well as color Doppler and duplex ultrasound were performed to evaluate the lower extremity deep venous systems from the level of the common femoral vein and including the common femoral, femoral, profunda femoral, popliteal and calf veins including the posterior tibial, peroneal and gastrocnemius veins when visible. The superficial great saphenous vein was also interrogated. Spectral Doppler was utilized to evaluate flow at rest and with distal augmentation maneuvers in the common femoral, femoral and popliteal veins. COMPARISON:  None. FINDINGS: Contralateral LEFT common Femoral Vein: Respiratory phasicity is normal and symmetric with the symptomatic side. No evidence of thrombus. Normal compressibility. RIGHT Common Femoral Vein: No evidence of thrombus. Normal compressibility, respiratory phasicity and response to augmentation. Saphenofemoral Junction: No evidence of thrombus. Normal compressibility and flow on color Doppler imaging. Profunda Femoral Vein: No evidence of thrombus. Normal compressibility and flow on color Doppler imaging. Femoral Vein: No evidence of thrombus. Normal compressibility, respiratory phasicity and response to augmentation. Popliteal Vein: No evidence of thrombus. Normal compressibility, respiratory phasicity and response to augmentation. Calf Veins: No evidence of thrombus. Normal compressibility and flow on color Doppler imaging. Superficial Great Saphenous Vein: No evidence of thrombus. Normal compressibility. Venous Reflux:  None visualized. Other Findings:  Edema in the subcutaneous tissues of the lower leg. IMPRESSION: No evidence of RIGHT lower extremity deep venous thrombosis. Electronically Signed   By: Hulan Saas M.D.   On: 08/20/2020 17:29    Procedures Procedures   Medications Ordered in ED Medications  potassium chloride SA (KLOR-CON) CR tablet 40 mEq (has no administration  in time range)    ED Course  I have reviewed the triage vital signs and the nursing notes.  Pertinent labs & imaging results that were available during my care of the patient were reviewed by me and considered in my medical decision making (see chart for details).  MDM Rules/Calculators/A&P                         Jeffrie Lofstrom is a 45 y.o. male with pertinent past medical history of hypertension, varicose veins and chronic left leg edema being followed by wound care the presents the emergency department today for right leg pain and swelling for the past week.  Appears as if this could be cellulitis with some new warmth and swelling, will obtain basic work-up and DVT study.  Patient is distally neurovascularly intact with normal gait.  Patient does not want me to undress his left lower extremity, denies any pain or worsening complaints at this area.  Work-up today unremarkable, CMP with potassium of 3.3, did replete.  CBC with no leukocytosis or anemia.  CK 413.  Normal lactic acid.  DVT study negative.  We will treat for cellulitis and have patient follow-up with PCP, did express importance of having patient follow-up in the next 2 days.  Strict return precautions given.  Patient to take Tylenol for pain.  Patient expressed understanding.   Doubt need for further emergent work up at this time. I explained the diagnosis and have given explicit precautions to return to the ER including for any other new or worsening symptoms. The patient understands and accepts the medical plan as it's been dictated and I have answered their questions. Discharge instructions concerning home care and prescriptions have been given. The patient is STABLE and is discharged to home in good condition.   Final Clinical Impression(s) / ED Diagnoses Final diagnoses:  Cellulitis of right lower extremity    Rx / DC Orders ED Discharge Orders         Ordered    cephALEXin (KEFLEX) 500 MG capsule  4 times daily         08/20/20 1820           Farrel Gordon, PA-C 08/20/20 1828    Charlynne Pander, MD 08/20/20 [phone]

## 2021-10-02 ENCOUNTER — Other Ambulatory Visit: Payer: Self-pay

## 2021-10-02 ENCOUNTER — Emergency Department (HOSPITAL_BASED_OUTPATIENT_CLINIC_OR_DEPARTMENT_OTHER): Payer: BC Managed Care – PPO

## 2021-10-02 ENCOUNTER — Encounter (HOSPITAL_BASED_OUTPATIENT_CLINIC_OR_DEPARTMENT_OTHER): Payer: Self-pay

## 2021-10-02 ENCOUNTER — Emergency Department (HOSPITAL_BASED_OUTPATIENT_CLINIC_OR_DEPARTMENT_OTHER)
Admission: EM | Admit: 2021-10-02 | Discharge: 2021-10-02 | Disposition: A | Discharge status: Home / Self Care | Payer: BC Managed Care – PPO | Attending: Emergency Medicine | Admitting: Emergency Medicine

## 2021-10-02 DIAGNOSIS — D72829 Elevated white blood cell count, unspecified: Secondary | ICD-10-CM | POA: Diagnosis not present

## 2021-10-02 DIAGNOSIS — R1013 Epigastric pain: Secondary | ICD-10-CM | POA: Insufficient documentation

## 2021-10-02 DIAGNOSIS — R748 Abnormal levels of other serum enzymes: Secondary | ICD-10-CM | POA: Diagnosis not present

## 2021-10-02 DIAGNOSIS — R7989 Other specified abnormal findings of blood chemistry: Secondary | ICD-10-CM

## 2021-10-02 DIAGNOSIS — Z79899 Other long term (current) drug therapy: Secondary | ICD-10-CM | POA: Diagnosis not present

## 2021-10-02 DIAGNOSIS — Z20822 Contact with and (suspected) exposure to covid-19: Secondary | ICD-10-CM | POA: Diagnosis not present

## 2021-10-02 DIAGNOSIS — I10 Essential (primary) hypertension: Secondary | ICD-10-CM | POA: Diagnosis not present

## 2021-10-02 DIAGNOSIS — R1010 Upper abdominal pain, unspecified: Secondary | ICD-10-CM | POA: Diagnosis present

## 2021-10-02 LAB — URINALYSIS, ROUTINE W REFLEX MICROSCOPIC
Bilirubin Urine: NEGATIVE
Glucose, UA: NEGATIVE mg/dL
Hgb urine dipstick: NEGATIVE
Ketones, ur: NEGATIVE mg/dL
Leukocytes,Ua: NEGATIVE
Nitrite: NEGATIVE
Protein, ur: 30 mg/dL — AB
Specific Gravity, Urine: 1.015 (ref 1.005–1.030)
pH: 6 (ref 5.0–8.0)

## 2021-10-02 LAB — CBC WITH DIFFERENTIAL/PLATELET
Abs Immature Granulocytes: 0.1 10*3/uL — ABNORMAL HIGH (ref 0.00–0.07)
Basophils Absolute: 0.1 10*3/uL (ref 0.0–0.1)
Basophils Relative: 1 %
Eosinophils Absolute: 0.1 10*3/uL (ref 0.0–0.5)
Eosinophils Relative: 1 %
HCT: 37.4 % — ABNORMAL LOW (ref 39.0–52.0)
Hemoglobin: 12.5 g/dL — ABNORMAL LOW (ref 13.0–17.0)
Immature Granulocytes: 1 %
Lymphocytes Relative: 9 %
Lymphs Abs: 1 10*3/uL (ref 0.7–4.0)
MCH: 29.1 pg (ref 26.0–34.0)
MCHC: 33.4 g/dL (ref 30.0–36.0)
MCV: 87.2 fL (ref 80.0–100.0)
Monocytes Absolute: 1.2 10*3/uL — ABNORMAL HIGH (ref 0.1–1.0)
Monocytes Relative: 10 %
Neutro Abs: 9.1 10*3/uL — ABNORMAL HIGH (ref 1.7–7.7)
Neutrophils Relative %: 78 %
Platelets: 327 10*3/uL (ref 150–400)
RBC: 4.29 MIL/uL (ref 4.22–5.81)
RDW: 13.2 % (ref 11.5–15.5)
WBC: 11.5 10*3/uL — ABNORMAL HIGH (ref 4.0–10.5)
nRBC: 0 % (ref 0.0–0.2)

## 2021-10-02 LAB — COMPREHENSIVE METABOLIC PANEL
ALT: 96 U/L — ABNORMAL HIGH (ref 0–44)
AST: 85 U/L — ABNORMAL HIGH (ref 15–41)
Albumin: 2.2 g/dL — ABNORMAL LOW (ref 3.5–5.0)
Alkaline Phosphatase: 86 U/L (ref 38–126)
Anion gap: 3 — ABNORMAL LOW (ref 5–15)
BUN: 14 mg/dL (ref 6–20)
CO2: 23 mmol/L (ref 22–32)
Calcium: 8 mg/dL — ABNORMAL LOW (ref 8.9–10.3)
Chloride: 107 mmol/L (ref 98–111)
Creatinine, Ser: 1.12 mg/dL (ref 0.61–1.24)
GFR, Estimated: >60 mL/min (ref >60)
Glucose, Bld: 155 mg/dL — ABNORMAL HIGH (ref 70–99)
Potassium: 3.9 mmol/L (ref 3.5–5.1)
Sodium: 133 mmol/L — ABNORMAL LOW (ref 135–145)
Total Bilirubin: 1 mg/dL (ref 0.3–1.2)
Total Protein: 7.6 g/dL (ref 6.5–8.1)

## 2021-10-02 LAB — URINALYSIS, MICROSCOPIC (REFLEX): RBC / HPF: NONE SEEN RBC/hpf (ref 0–5)

## 2021-10-02 LAB — LIPASE, BLOOD: Lipase: 58 U/L — ABNORMAL HIGH (ref 11–51)

## 2021-10-02 LAB — RESP PANEL BY RT-PCR (FLU A&B, COVID) ARPGX2
Influenza A by PCR: NEGATIVE
Influenza B by PCR: NEGATIVE
SARS Coronavirus 2 by RT PCR: NEGATIVE

## 2021-10-02 MED ORDER — SODIUM CHLORIDE 0.9 % IV BOLUS
1000.0000 mL | Freq: Once | INTRAVENOUS | Status: AC
  Administered 2021-10-02: 1000 mL via INTRAVENOUS

## 2021-10-02 MED ORDER — IOHEXOL 300 MG/ML  SOLN
100.0000 mL | Freq: Once | INTRAMUSCULAR | Status: AC | PRN
  Administered 2021-10-02: 100 mL via INTRAVENOUS

## 2021-10-02 NOTE — ED Triage Notes (Signed)
Pt presents to ED from home C/O upper abdominal pain and intermittent chills X 3 weeks. Denies n/v. Reports watery diarrhea X 2 weeks.

## 2021-10-02 NOTE — ED Provider Notes (Signed)
[facility] EMERGENCY DEPARTMENT Provider Note   CSN: [id number] Arrival date & time: 10/02/21  1444     History  Chief Complaint  Patient presents with   Abdominal Pain   Diarrhea    Stephen Humphrey is a 46 y.o. male with a medical hx of hypertension, prediabetic, obesity, elevated cholesterol, who presents to the Emergency Department complaining of intermittent upper abdominal pain onset 3 weeks. He has associated watery nonbloody diarrhea x 2 weeks, intermittent chills. Unsure of sick contacts. No meds tried PTA. Denies fever, chest pain, shortness of breath, blood in stool, constipation, dysuria, hematuria, nausea, vomiting. Denies excessive ETOH use. Notes that he has been taking 1,000 mg tylenol daily for awhile now. Denies PMHx of GERD, diabetes, CAD, cardiac catheterization, stents.  Has a history of HTN and is compliant with medications.  Per patient chart review: Patient was evaluated by his gastroenterologist on 09/27/2021.  At that time it was determined that he had elevated LFTs due to nonalcoholic fatty liver disease.  GI recommendations at that time where further liver work-up to rule out Wilson's disease, alpha-1 antitrypsin deficiency, autoimmune hepatitis, PBC, hemochromatosis.  Genotyping also completed at that time.  Also recommended patient continue to decrease Tylenol intake due to elevated LFTs.  Continue cessation of alcohol and recommended low-fat diet and weight loss.  Also recommended daily Benefiber for constipation and right upper quadrant pain.  Patient was also evaluated by his surgeon on 09/27/2021 for incidental findings of lymphadenopathy noted to the pelvis region.  Recommendations is for repeat CT pelvis and further evaluation in clinic to discuss possible biopsy of the incidental lymphadenopathy is noted.  CT pelvis scan ordered for 10/04/21.  Patient has a follow-up visit with this general surgery office on 10/18/2021.   The history is provided by the  patient. No language interpreter was used.      Home Medications Prior to Admission medications   Medication Sig Start Date End Date Taking? Authorizing Provider  amLODipine (NORVASC) 5 MG tablet Take 5 mg by mouth daily.    [provider]  atorvastatin (LIPITOR) 20 MG tablet Take 1 tablet by mouth daily. 03/30/20   [provider]  cephALEXin (KEFLEX) 500 MG capsule Take 1 capsule (500 mg total) by mouth 4 (four) times daily. 08/20/20   Farrel Gordon, PA-C  EPINEPHrine 0.3 mg/0.3 mL IJ SOAJ injection Inject 0.3 mLs (0.3 mg total) into the muscle once. 03/17/14   Charlynne Pander, MD  furosemide (LASIX) 40 MG tablet Take 40 mg by mouth daily.    [provider]  gabapentin (NEURONTIN) 300 MG capsule Take by mouth. 08/18/20 11/16/20  [provider]  hydrALAZINE (APRESOLINE) 50 MG tablet Take 1 tablet by mouth 3 (three) times daily. 07/13/20   [provider]  hydrochlorothiazide (HYDRODIURIL) 25 MG tablet Take 1 tablet (25 mg total) by mouth daily. Patient not taking: Reported on 02/07/2016 12/10/14   Elson Areas, PA-C  ibuprofen (ADVIL,MOTRIN) 800 MG tablet Take 1 tablet (800 mg total) by mouth every 8 (eight) hours as needed for mild pain. 07/17/18   Ward, Layla Maw, DO  losartan (COZAAR) 100 MG tablet Take by mouth daily. 12/26/16   [provider]  losartan-hydrochlorothiazide (HYZAAR) 100-25 MG tablet Take 1 tablet by mouth daily.    [provider]  metoprolol (LOPRESSOR) 50 MG tablet Take 1 tablet (50 mg total) by mouth 2 (two) times daily. Patient not taking: Reported on 02/07/2016 12/10/14   Elson Areas,  PA-C  oxyCODONE-acetaminophen (PERCOCET/ROXICET) 5-325 MG tablet Take 2 tablets by mouth every 6 (six) hours as needed for severe pain. 07/17/18   Ward, Layla MawKristen N, DO  potassium chloride SA (K-DUR,KLOR-CON) 20 MEQ tablet Take 1 tablet (20 mEq total) by mouth 2 (two) times daily. 12/10/14   Elson AreasSofia, Leslie K, PA-C  predniSONE  (STERAPRED UNI-PAK 21 TAB) 10 MG (21) TBPK tablet Take as directed 07/17/18   Ward, Layla MawKristen N, DO      Allergies    Patient has no known allergies.    Review of Systems   Review of Systems  Constitutional:  Positive for chills. Negative for fever.  Respiratory:  Negative for shortness of breath.   Cardiovascular:  Negative for chest pain.  Gastrointestinal:  Positive for abdominal pain and diarrhea. Negative for blood in stool, constipation, nausea and vomiting.  Genitourinary:  Negative for dysuria and hematuria.  All other systems reviewed and are negative.  Physical Exam Updated Vital Signs BP 112/71   Pulse 84   Temp 98.9 F (37.2 C) (Oral)   Resp 18   Ht 5\' 10"  (1.778 m)   Wt 128.8 kg   SpO2 97%   BMI 40.75 kg/m  Physical Exam Vitals and nursing note reviewed.  Constitutional:      General: He is not in acute distress.    Appearance: He is not diaphoretic.  HENT:     Head: Normocephalic and atraumatic.     Mouth/Throat:     Pharynx: No oropharyngeal exudate.  Eyes:     General: No scleral icterus.    Conjunctiva/sclera: Conjunctivae normal.  Cardiovascular:     Rate and Rhythm: Normal rate and regular rhythm.     Pulses: Normal pulses.     Heart sounds: Normal heart sounds.  Pulmonary:     Effort: Pulmonary effort is normal. No respiratory distress.     Breath sounds: Normal breath sounds. No wheezing.  Abdominal:     General: Bowel sounds are normal.     Palpations: Abdomen is soft. There is no mass.     Tenderness: There is abdominal tenderness in the epigastric area. There is no guarding or rebound.  Musculoskeletal:        General: Normal range of motion.     Cervical back: Normal range of motion and neck supple.     Comments: Strength and sensation intact to bilateral upper and lower extremities.  Skin:    General: Skin is warm and dry.  Neurological:     Mental Status: He is alert.  Psychiatric:        Behavior: Behavior normal.    ED Results /  Procedures / Treatments   Labs (all labs ordered are listed, but only abnormal results are displayed) Labs Reviewed  CBC WITH DIFFERENTIAL/PLATELET - Abnormal; Notable for the following components:      Result Value   WBC 11.5 (*)    Hemoglobin 12.5 (*)    HCT 37.4 (*)    Neutro Abs 9.1 (*)    Monocytes Absolute 1.2 (*)    Abs Immature Granulocytes 0.10 (*)    All other components within normal limits  COMPREHENSIVE METABOLIC PANEL - Abnormal; Notable for the following components:   Sodium 133 (*)    Glucose, Bld 155 (*)    Calcium 8.0 (*)    Albumin 2.2 (*)    AST 85 (*)    ALT 96 (*)    Anion gap 3 (*)    All other components  within normal limits  LIPASE, BLOOD - Abnormal; Notable for the following components:   Lipase 58 (*)    All other components within normal limits  URINALYSIS, ROUTINE W REFLEX MICROSCOPIC - Abnormal; Notable for the following components:   Color, Urine AMBER (*)    Protein, ur 30 (*)    All other components within normal limits  URINALYSIS, MICROSCOPIC (REFLEX) - Abnormal; Notable for the following components:   Bacteria, UA RARE (*)    All other components within normal limits  RESP PANEL BY RT-PCR (FLU A&B, COVID) [id number]    EKG None  Radiology CT ABDOMEN PELVIS W CONTRAST  Result Date: 10/02/2021 CLINICAL DATA:  Upper abdominal pain and intermittent chills for 3 weeks. Watery diarrhea for 2 weeks. EXAM: CT ABDOMEN AND PELVIS WITH CONTRAST TECHNIQUE: Multidetector CT imaging of the abdomen and pelvis was performed using the standard protocol following bolus administration of intravenous contrast. RADIATION DOSE REDUCTION: This exam was performed according to the departmental dose-optimization program which includes automated exposure control, adjustment of the mA and/or kV according to patient size and/or use of iterative reconstruction technique. CONTRAST:  OMNIPAQUE IOHEXOL 300 MG/ML  SOLN COMPARISON:  08/20/2021 FINDINGS: Lower chest: Mild  dependent subsegmental atelectasis in both lower lobes. Hepatobiliary: Mildly contracted gallbladder. Otherwise unremarkable. Pancreas: Unremarkable Spleen: Spleen measures 12.6 by 6.8 by 10.6 cm (volume = 480 cm^3), compatible with mild splenomegaly. Mildly reduced from prior measurement of 530 cc. No focal splenic lesion identified. Adrenals/Urinary Tract: Stable 10 mm nonspecific hypodense lesion in the right kidney upper pole on image 31 series 2 and stable nonspecific subcentimeter hypodense lesions of the left kidney. Trace nonspecific perirenal stranding on the right. No hydronephrosis or hydroureter. No definite urinary tract calculus. Urinary bladder unremarkable. The adrenal glands appear unremarkable. Stomach/Bowel: Unremarkable Vascular/Lymphatic: Scattered small retroperitoneal lymph nodes and subtle retroperitoneal stranding which is nonspecific. 1.2 cm peripancreatic lymph node on image 28 series 2, stable. Right external iliac node 1.3 cm in short axis on image 81 series 2, mildly prominent and previously 1.5 cm on 05/04/2021. Clustered inguinal lymph nodes including a 1.2 cm left inguinal lymph node on image 96 series 2, formerly 1.5 cm. The vertical level of the larger left inferior inguinal lymph node shown on 05/04/2021 was not included on today's exam. Reproductive: Unremarkable Other: No supplemental non-categorized findings. Musculoskeletal: Small supraumbilical hernias contain adipose tissue. Lower thoracic spondylosis. Mild degenerative arthropathy of both hips. IMPRESSION: 1. Mild splenomegaly, improved from prior. Stable mildly prominent peripancreatic lymph node and mild reduction in size of the mildly prominent right external iliac lymph node. 2. No specific bowel abnormality is currently identified. 3. Trace right perirenal stranding. Small hypodense renal lesions are likely benign. Correlate with urine analysis in assessing whether any further renal workup is indicated. 4. Small  supraumbilical hernias contain adipose tissue. Electronically Signed   By: Gaylyn Rong M.D.   On: 10/02/2021 18:10    Procedures Procedures    Medications Ordered in ED Medications  sodium chloride 0.9 % bolus 1,000 mL (0 mLs Intravenous Stopped 10/02/21 1636)  iohexol (OMNIPAQUE) 300 MG/ML solution 100 mL (100 mLs Intravenous Contrast Given 10/02/21 1700)    ED Course/ Medical Decision Making/ A&P Clinical Course as of 10/02/21 2327  Tue Oct 02, 2021  1640 Re-evaluated and noted improvement of symptoms in the ED. [SB]  1718 Updated regarding lab findings.  Answered all available questions. [SB]  1832 Pt re-evaluated and noted improvement of symptoms with treatment regimen in the  ED. [SB]  1916 Re-evaluated and noted improvement of symptoms in the ED. Discussed discharge treatment plan with patient at bedside. [SB]    Clinical Course User Index [SB] Momina Hunton A, PA-C                           Medical Decision Making Amount and/or Complexity of Data Reviewed Labs: ordered. Radiology: ordered.  Risk Prescription drug management.   Patient presents to the emergency department with abdominal pain onset 3 weeks.  Has associated nonbloody diarrhea x2 weeks.  Recently evaluated by GI doctor on 09/27/2021.  Also evaluated by general surgeon on 09/27/2021.  Vital signs stable, patient afebrile.  On exam patient with mild tenderness to palpation to epigastric region.  No acute cardiovascular or respiratory exam findings. Differential diagnosis includes pancreatitis, cholecystitis, appendicitis, UTI, GERD.   Comorbidities affecting patient care Hypertension Prediabetic Obesity Elevated cholesterol  Additional history obtained:  Additional history obtained from Spouse/Significant Other External records from outside source obtained and reviewed including: Patient was evaluated by his gastroenterologist on 09/27/2021.  At that time it was determined that he had elevated LFTs due to  nonalcoholic fatty liver disease.  GI recommendations at that time where further liver work-up to rule out Wilson's disease, alpha-1 antitrypsin deficiency, autoimmune hepatitis, PBC, hemochromatosis.  Genotyping also completed at that time.  Also recommended patient continue to decrease Tylenol intake due to elevated LFTs.  Continue cessation of alcohol and recommended low-fat diet and weight loss.  Also recommended daily Benefiber for constipation and right upper quadrant pain. Patient was also evaluated by his surgeon on 09/27/2021 for incidental findings of lymphadenopathy noted to the pelvis region.  Recommendations is for repeat CT pelvis and further evaluation in clinic to discuss possible biopsy of the incidental lymphadenopathy is noted.  CT pelvis scan ordered for 10/04/21.  Patient has a follow-up visit with this general surgery office on 10/18/2021.  Labs:  I ordered, and personally interpreted labs.  The pertinent results include:   CBC with slightly elevated WBC at 11.5, hemoglobin slightly down at 12.5 otherwise unremarkable. Urinalysis without indication for UTI. CMP with slightly elevated glucose at 155, AST at 85, ALT at 96 otherwise unremarkable.  LFTs overall stable from previous values on 09/27/2021 (09/27/2021 labs-albumin of 2.9 alkaline phosphatase at 101, AST 82, ALT 92) Lipase slightly elevated at 58 COVID and flu swab negative  Imaging: I ordered imaging studies including CT abdomen pelvis I independently visualized and interpreted imaging which showed  1. Mild splenomegaly, improved from prior. Stable mildly prominent  peripancreatic lymph node and mild reduction in size of the mildly  prominent right external iliac lymph node.  2. No specific bowel abnormality is currently identified.  3. Trace right perirenal stranding. Small hypodense renal lesions  are likely benign. Correlate with urine analysis in assessing  whether any further renal workup is indicated.  4. Small  supraumbilical hernias contain adipose tissue.   I agree with the radiologist interpretation  Medications:  I ordered medication including IV fluids for symptom management. Reevaluation of the patient after these medicines and interventions, I reevaluated the patient and found that they have improved I have reviewed the patients home medicines and have made adjustments as needed   Disposition: Presentation suspicious for viral etiology as cause of patient's symptoms.  Doubt pancreatitis, cholecystitis, appendicitis at this time.  Patient's work-up in the ED overall stable from previous values on 09/27/2021 and in April 2023.  No  acute abnormalities or findings noted on patient's CT scan or labs today.  After consideration of the diagnostic results and the patients response to treatment, I feel that the patient would benefit from Discharge home.  Instructed patient to maintain follow-up with her gastroenterologist as scheduled.  Also instructed patient to maintain follow-up with the general surgeon as scheduled.  Supportive care measures and strict return precautions discussed with patient at bedside. Pt acknowledges and verbalizes understanding. Pt appears safe for discharge. Follow up as indicated in discharge paperwork.   This chart was dictated using voice recognition software, Dragon. Despite the best efforts of this provider to proofread and correct errors, errors may still occur which can change documentation meaning.   Final Clinical Impression(s) / ED Diagnoses Final diagnoses:  Epigastric pain  Elevated LFTs    Rx / DC Orders ED Discharge Orders     None         Taris Galindo A, PA-C 10/02/21 2327    Vanetta Mulders, MD 10/19/21 1527

## 2021-10-02 NOTE — Discharge Instructions (Addendum)
It was a pleasure taking care of you today!  Call your GI doctor tomorrow to set up a follow-up appointment regarding today's ED visit.  Call your general surgeon's office tomorrow to set up a follow-up appointment regarding today's ED visit.  Ensure to maintain fluid intake with water, tea, broth, soup, Pedialyte, Gatorade.  Return to the emergency department for experiencing increasing/worsening abdominal pain, excessive vomiting, decreased fluid intake, worsening symptoms.

## 2021-10-16 ENCOUNTER — Emergency Department (HOSPITAL_BASED_OUTPATIENT_CLINIC_OR_DEPARTMENT_OTHER): Payer: BC Managed Care – PPO

## 2021-10-16 ENCOUNTER — Encounter (HOSPITAL_BASED_OUTPATIENT_CLINIC_OR_DEPARTMENT_OTHER): Payer: Self-pay | Admitting: Emergency Medicine

## 2021-10-16 ENCOUNTER — Emergency Department (HOSPITAL_BASED_OUTPATIENT_CLINIC_OR_DEPARTMENT_OTHER)
Admission: EM | Admit: 2021-10-16 | Discharge: 2021-10-16 | Disposition: A | Discharge status: Home / Self Care | Payer: BC Managed Care – PPO | Attending: Emergency Medicine | Admitting: Emergency Medicine

## 2021-10-16 ENCOUNTER — Other Ambulatory Visit: Payer: Self-pay

## 2021-10-16 DIAGNOSIS — R590 Localized enlarged lymph nodes: Secondary | ICD-10-CM | POA: Insufficient documentation

## 2021-10-16 DIAGNOSIS — R197 Diarrhea, unspecified: Secondary | ICD-10-CM | POA: Insufficient documentation

## 2021-10-16 DIAGNOSIS — R1013 Epigastric pain: Secondary | ICD-10-CM | POA: Insufficient documentation

## 2021-10-16 DIAGNOSIS — Z79899 Other long term (current) drug therapy: Secondary | ICD-10-CM | POA: Diagnosis not present

## 2021-10-16 DIAGNOSIS — R161 Splenomegaly, not elsewhere classified: Secondary | ICD-10-CM | POA: Insufficient documentation

## 2021-10-16 DIAGNOSIS — I10 Essential (primary) hypertension: Secondary | ICD-10-CM | POA: Diagnosis not present

## 2021-10-16 DIAGNOSIS — R1033 Periumbilical pain: Secondary | ICD-10-CM | POA: Diagnosis present

## 2021-10-16 DIAGNOSIS — R110 Nausea: Secondary | ICD-10-CM | POA: Insufficient documentation

## 2021-10-16 LAB — COMPREHENSIVE METABOLIC PANEL
ALT: 102 U/L — ABNORMAL HIGH (ref 0–44)
AST: 99 U/L — ABNORMAL HIGH (ref 15–41)
Albumin: 2 g/dL — ABNORMAL LOW (ref 3.5–5.0)
Alkaline Phosphatase: 96 U/L (ref 38–126)
Anion gap: 7 (ref 5–15)
BUN: 14 mg/dL (ref 6–20)
CO2: 21 mmol/L — ABNORMAL LOW (ref 22–32)
Calcium: 7.9 mg/dL — ABNORMAL LOW (ref 8.9–10.3)
Chloride: 102 mmol/L (ref 98–111)
Creatinine, Ser: 1.24 mg/dL (ref 0.61–1.24)
GFR, Estimated: >60 mL/min (ref >60)
Glucose, Bld: 136 mg/dL — ABNORMAL HIGH (ref 70–99)
Potassium: 3.9 mmol/L (ref 3.5–5.1)
Sodium: 130 mmol/L — ABNORMAL LOW (ref 135–145)
Total Bilirubin: 1 mg/dL (ref 0.3–1.2)
Total Protein: 7.9 g/dL (ref 6.5–8.1)

## 2021-10-16 LAB — URINALYSIS, MICROSCOPIC (REFLEX)

## 2021-10-16 LAB — URINALYSIS, ROUTINE W REFLEX MICROSCOPIC
Glucose, UA: NEGATIVE mg/dL
Hgb urine dipstick: NEGATIVE
Ketones, ur: NEGATIVE mg/dL
Leukocytes,Ua: NEGATIVE
Nitrite: NEGATIVE
Protein, ur: 30 mg/dL — AB
Specific Gravity, Urine: 1.015 (ref 1.005–1.030)
pH: 5.5 (ref 5.0–8.0)

## 2021-10-16 LAB — CBC
HCT: 33.6 % — ABNORMAL LOW (ref 39.0–52.0)
Hemoglobin: 11.6 g/dL — ABNORMAL LOW (ref 13.0–17.0)
MCH: 29.8 pg (ref 26.0–34.0)
MCHC: 34.5 g/dL (ref 30.0–36.0)
MCV: 86.4 fL (ref 80.0–100.0)
Platelets: 341 10*3/uL (ref 150–400)
RBC: 3.89 MIL/uL — ABNORMAL LOW (ref 4.22–5.81)
RDW: 14.1 % (ref 11.5–15.5)
WBC: 16.1 10*3/uL — ABNORMAL HIGH (ref 4.0–10.5)
nRBC: 0 % (ref 0.0–0.2)

## 2021-10-16 LAB — LIPASE, BLOOD: Lipase: 61 U/L — ABNORMAL HIGH (ref 11–51)

## 2021-10-16 MED ORDER — ONDANSETRON HCL 4 MG/2ML IJ SOLN
4.0000 mg | Freq: Once | INTRAMUSCULAR | Status: AC
  Administered 2021-10-16: 4 mg via INTRAVENOUS
  Filled 2021-10-16: qty 2

## 2021-10-16 MED ORDER — PANTOPRAZOLE SODIUM 20 MG PO TBEC: 40.0000 mg | DELAYED_RELEASE_TABLET | Freq: Every day | ORAL | 0 refills | Status: AC

## 2021-10-16 MED ORDER — DICYCLOMINE HCL 20 MG PO TABS: 20.0000 mg | ORAL_TABLET | Freq: Two times a day (BID) | ORAL | 0 refills | Status: AC

## 2021-10-16 MED ORDER — IOHEXOL 300 MG/ML  SOLN
100.0000 mL | Freq: Once | INTRAMUSCULAR | Status: AC | PRN
  Administered 2021-10-16: 100 mL via INTRAVENOUS

## 2021-10-16 MED ORDER — ACETAMINOPHEN 500 MG PO TABS
1000.0000 mg | ORAL_TABLET | Freq: Once | ORAL | Status: AC
  Administered 2021-10-16: 1000 mg via ORAL
  Filled 2021-10-16: qty 2

## 2021-10-16 NOTE — ED Triage Notes (Signed)
Pt is c/o abd pain for the past 3 weeks and describes as it does not feel like his food is passing like it should  Pt states the pain is around the umbilical area  Pt adds that he has had diarrhea for the past 4 weeks  Pt states his thighs are aching and he feels weak

## 2021-10-16 NOTE — ED Provider Notes (Signed)
46 year old male comes in with 1 month history of periumbilical pain and diarrhea.  Symptoms have been stable over that time, but states he just got tired of it being present.  No nausea or vomiting and no fever or chills.  On exam, abdomen is soft and nontender.  MSE was initiated and I personally evaluated the patient and placed orders at  6:39 AM on October 16, 2021.  The patient appears stable so that the remainder of the MSE may be completed by another provider.   Delora Fuel, MD [id number] [phone]

## 2021-10-16 NOTE — ED Notes (Signed)
ED Provider at bedside. 

## 2021-10-16 NOTE — ED Notes (Signed)
Patient transported to CT 

## 2021-10-16 NOTE — ED Provider Notes (Signed)
[facility] EMERGENCY DEPARTMENT Provider Note   CSN: [id number] Arrival date & time: 10/16/21  4970     History  Chief Complaint  Patient presents with   Abdominal Pain    Stephen Humphrey is a 46 y.o. male.  HPI     Abdominal pain periumbilical and epigastric for 4 weeks Associated nausea Better with defecation than comes back Diarrhea twice per day, no black or bloody stools, is dark No urinary symptoms No fever, before about to have BM will feel hot/sweat No recent abx, travel, suspicious foods, no sic contacts Does worsen with eating No abd surgeries 10/10  Past Medical History:  Diagnosis Date   Hypertension    Leg edema, left    Varicose veins    Past Surgical History:  Procedure Laterality Date   LEG SURGERY      Home Medications Prior to Admission medications   Medication Sig Start Date End Date Taking? Authorizing Provider  dicyclomine (BENTYL) 20 MG tablet Take 1 tablet (20 mg total) by mouth 2 (two) times daily. 10/16/21  Yes Alvira Monday, MD  pantoprazole (PROTONIX) 20 MG tablet Take 2 tablets (40 mg total) by mouth daily for 14 days. 10/16/21 10/30/21 Yes Alvira Monday, MD  amLODipine (NORVASC) 5 MG tablet Take 5 mg by mouth daily.    [provider]  atorvastatin (LIPITOR) 20 MG tablet Take 1 tablet by mouth daily. 03/30/20   [provider]  cephALEXin (KEFLEX) 500 MG capsule Take 1 capsule (500 mg total) by mouth 4 (four) times daily. 08/20/20   Farrel Gordon, PA-C  EPINEPHrine 0.3 mg/0.3 mL IJ SOAJ injection Inject 0.3 mLs (0.3 mg total) into the muscle once. 03/17/14   Charlynne Pander, MD  furosemide (LASIX) 40 MG tablet Take 40 mg by mouth daily.    [provider]  gabapentin (NEURONTIN) 300 MG capsule Take by mouth. 08/18/20 11/16/20  [provider]  hydrALAZINE (APRESOLINE) 50 MG tablet Take 1 tablet by mouth 3 (three) times daily. 07/13/20   [provider]  hydrochlorothiazide (HYDRODIURIL)  25 MG tablet Take 1 tablet (25 mg total) by mouth daily. Patient not taking: Reported on 02/07/2016 12/10/14   Elson Areas, PA-C  ibuprofen (ADVIL,MOTRIN) 800 MG tablet Take 1 tablet (800 mg total) by mouth every 8 (eight) hours as needed for mild pain. 07/17/18   Ward, Layla Maw, DO  losartan (COZAAR) 100 MG tablet Take by mouth daily. 12/26/16   [provider]  losartan-hydrochlorothiazide (HYZAAR) 100-25 MG tablet Take 1 tablet by mouth daily.    [provider]  metoprolol (LOPRESSOR) 50 MG tablet Take 1 tablet (50 mg total) by mouth 2 (two) times daily. Patient not taking: Reported on 02/07/2016 12/10/14   Elson Areas, PA-C  oxyCODONE-acetaminophen (PERCOCET/ROXICET) 5-325 MG tablet Take 2 tablets by mouth every 6 (six) hours as needed for severe pain. 07/17/18   Ward, Layla Maw, DO  potassium chloride SA (K-DUR,KLOR-CON) 20 MEQ tablet Take 1 tablet (20 mEq total) by mouth 2 (two) times daily. 12/10/14   Elson Areas, PA-C  predniSONE (STERAPRED UNI-PAK 21 TAB) 10 MG (21) TBPK tablet Take as directed 07/17/18   Ward, Layla Maw, DO      Allergies    Patient has no known allergies.    Review of Systems   Review of Systems  Physical Exam Updated Vital Signs BP 131/85   Pulse 80   Temp 97.7 F (36.5 C) (Oral)   Resp 17  Ht 5\' 10"  (1.778 m)   Wt 130.5 kg   SpO2 99%   BMI 41.30 kg/m  Physical Exam Vitals and nursing note reviewed.  Constitutional:      General: He is not in acute distress.    Appearance: He is well-developed. He is not diaphoretic.  HENT:     Head: Normocephalic and atraumatic.  Eyes:     Conjunctiva/sclera: Conjunctivae normal.  Cardiovascular:     Rate and Rhythm: Normal rate and regular rhythm.     Heart sounds: Normal heart sounds. No murmur heard.   No friction rub. No gallop.  Pulmonary:     Effort: Pulmonary effort is normal. No respiratory distress.     Breath sounds: Normal breath sounds. No wheezing or rales.  Abdominal:      General: There is no distension.     Palpations: Abdomen is soft.     Tenderness: There is abdominal tenderness in the epigastric area, periumbilical area and left upper quadrant. There is no guarding.  Musculoskeletal:     Cervical back: Normal range of motion.  Skin:    General: Skin is warm and dry.  Neurological:     Mental Status: He is alert and oriented to person, place, and time.    ED Results / Procedures / Treatments   Labs (all labs ordered are listed, but only abnormal results are displayed) Labs Reviewed  LIPASE, BLOOD - Abnormal; Notable for the following components:      Result Value   Lipase 61 (*)    All other components within normal limits  COMPREHENSIVE METABOLIC PANEL - Abnormal; Notable for the following components:   Sodium 130 (*)    CO2 21 (*)    Glucose, Bld 136 (*)    Calcium 7.9 (*)    Albumin 2.0 (*)    AST 99 (*)    ALT 102 (*)    All other components within normal limits  CBC - Abnormal; Notable for the following components:   WBC 16.1 (*)    RBC 3.89 (*)    Hemoglobin 11.6 (*)    HCT 33.6 (*)    All other components within normal limits  URINALYSIS, ROUTINE W REFLEX MICROSCOPIC - Abnormal; Notable for the following components:   Color, Urine AMBER (*)    Bilirubin Urine SMALL (*)    Protein, ur 30 (*)    All other components within normal limits  URINALYSIS, MICROSCOPIC (REFLEX) - Abnormal; Notable for the following components:   Bacteria, UA RARE (*)    All other components within normal limits    EKG None  Radiology CT ABDOMEN PELVIS W CONTRAST  Result Date: 10/16/2021 CLINICAL DATA:  Periumbilical abdominal pain for 1 month.  Diarrhea. EXAM: CT ABDOMEN AND PELVIS WITH CONTRAST TECHNIQUE: Multidetector CT imaging of the abdomen and pelvis was performed using the standard protocol following bolus administration of intravenous contrast. RADIATION DOSE REDUCTION: This exam was performed according to the departmental dose-optimization  program which includes automated exposure control, adjustment of the mA and/or kV according to patient size and/or use of iterative reconstruction technique. CONTRAST:  100mL OMNIPAQUE IOHEXOL 300 MG/ML  SOLN COMPARISON:  Prior CT scan 10/02/2021 and 09/04/2020 FINDINGS: Lower chest: The lung bases are clear of acute process. No pleural effusion or pulmonary lesions. The heart is normal in size. Small pericardial effusion is noted. The distal esophagus and aorta are unremarkable. Hepatobiliary: No hepatic lesions are identified. No intrahepatic biliary dilatation. The gallbladder is unremarkable. No common  bile duct dilatation. Pancreas: No mass, inflammation or ductal dilatation. Spleen: Stable borderline splenomegaly. Spleen measures 14 x 11 x 11 cm. No splenic lesions. Adrenals/Urinary Tract: The adrenal glands are unremarkable. Stable small renal cysts. No worrisome renal lesions or hydroureteronephrosis. Mild uniform bladder wall thickening likely due to lack of distension. Stomach/Bowel: The stomach, duodenum small and colon are grossly normal. No acute inflammatory process, mass lesions or obstructive findings. The terminal ileum and appendix are normal. Vascular/Lymphatic: The aorta and branch vessels are patent. No aneurysm or dissection. The major venous structures are patent. Borderline celiac axis, retroperitoneal, pelvic and inguinal lymph nodes slightly progressive when compared to prior study from 2022. Could not exclude a low grade lymphoproliferative process. Recommend clinical correlation. Lymph node biopsy may be indicated. Reproductive: Mild prostate gland enlargement. The seminal vesicles are unremarkable. Other: No ascites abdominal hernia.  No inguinal hernia. Musculoskeletal: No significant bony findings. IMPRESSION: 1. No acute abdominal/pelvic findings or mass lesions. 2. Borderline celiac axis, retroperitoneal, pelvic and inguinal lymph nodes slightly progressive when compared to prior CT  from 2022. This in conjunction with borderline splenomegaly could suggest a low grade smoldering lymphoproliferative process. Lymph node biopsy may be indicated. 3. Small pericardial effusion. Electronically Signed   By: Rudie Meyer M.D.   On: 10/16/2021 08:31    Procedures Procedures    Medications Ordered in ED Medications  acetaminophen (TYLENOL) tablet 1,000 mg (1,000 mg Oral Given 10/16/21 0718)  ondansetron (ZOFRAN) injection 4 mg (4 mg Intravenous Given 10/16/21 0719)  iohexol (OMNIPAQUE) 300 MG/ML solution 100 mL (100 mLs Intravenous Contrast Given 10/16/21 0729)    ED Course/ Medical Decision Making/ A&P                           Medical Decision Making Amount and/or Complexity of Data Reviewed Labs: ordered. Radiology: ordered.  Risk OTC drugs. Prescription drug management.   46yo male with history of hypertension presents with concern for abdominal pain.  DDx includes appendicitis, pancreatitis, cholecystitis, pyelonephritis, nephrolithiasis, diverticulitis SBO, gastritis.  Labs completed and interpreted by me show --similar transaminitis, similar mildly elevated lipase without signs of pancreatitis, slight increased leukocytosis.  UA without infection.    CT abdomen pelvis reviewed by me and radiology and shows mild progression of borderline lymphadenopathy, small pericardial effusion.  Discussed with oncology will have follow up.  Discussed with patient and  he is seeing surgery this week to discuss biopsy/removal for this known abnormality.  Unclear if this is etiology of pain.Recommend protonix and bentyl and continued outpatient follow up with PCP, GI, oncology/surgery. Patient discharged in stable condition with understanding of reasons to return.        Final Clinical Impression(s) / ED Diagnoses Final diagnoses:  Abdominal lymphadenopathy  Splenomegaly  Periumbilical abdominal pain    Rx / DC Orders ED Discharge Orders          Ordered    dicyclomine  (BENTYL) 20 MG tablet  2 times daily        10/16/21 0854    pantoprazole (PROTONIX) 20 MG tablet  Daily        10/16/21 0854              Alvira Monday, MD 10/16/21 2253

## 2021-10-16 NOTE — ED Notes (Signed)
Up to restroom to provide urine specimen

## 2022-11-22 IMAGING — CT CT ABD-PELV W/ CM
2 of 5 series · 15 of 46 positions shown, 17 images · IV contrast (Omnipaque)
Comparison: 08/20/2021

CLINICAL DATA: Upper abdominal pain and intermittent chills for 3
weeks. Watery diarrhea for 2 weeks.

EXAM:
CT ABDOMEN AND PELVIS WITH CONTRAST
TECHNIQUE: Multidetector CT imaging of the abdomen and pelvis was performed
using the standard protocol following bolus administration of
intravenous contrast.

[Series 2: axial st · axial · 0.98mm/px · z∈[+936,+1391]mm · 12 of 103 slices shown, 14 images]
[im 6/103  soft-tissue]
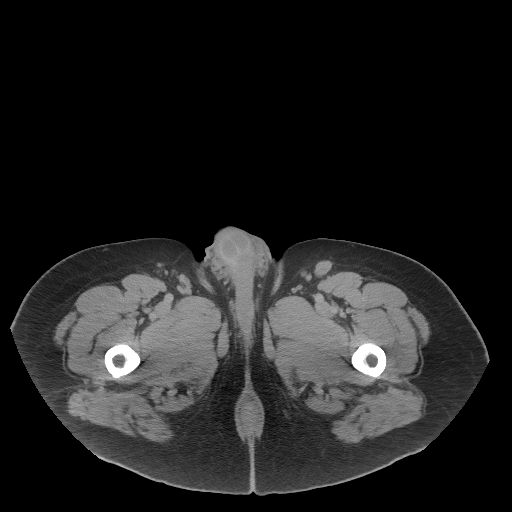
[im 6/103  bone]
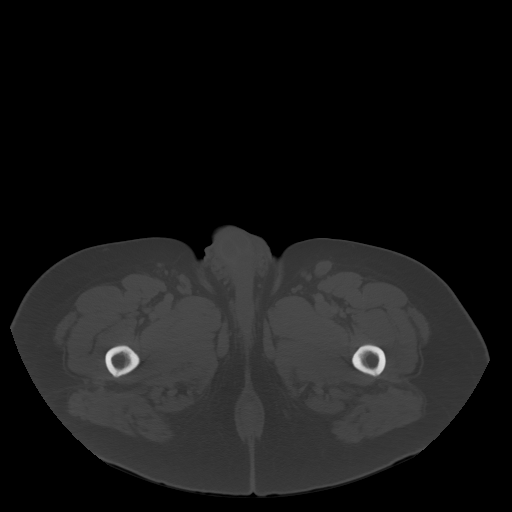
[im 17/103  soft-tissue]
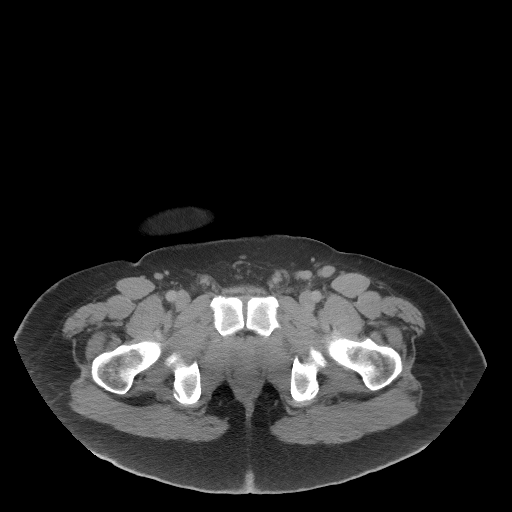
[im 22/103  soft-tissue]
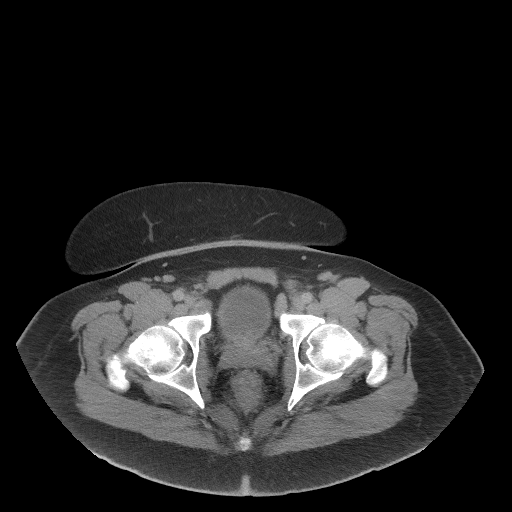
[im 33/103  soft-tissue]
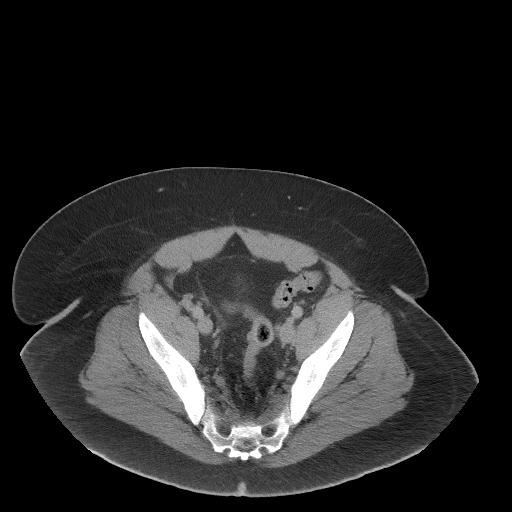
[im 38/103  soft-tissue]
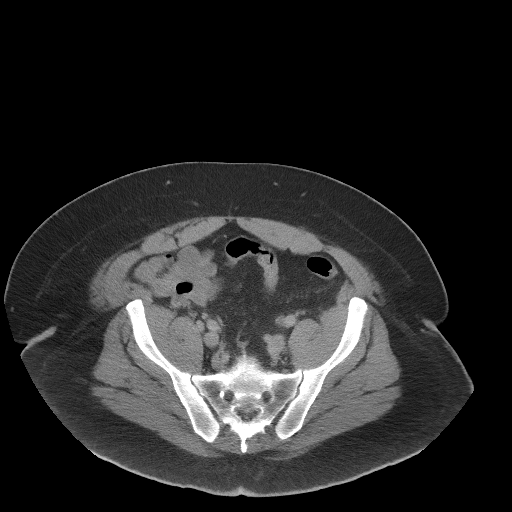
[im 49/103  soft-tissue]
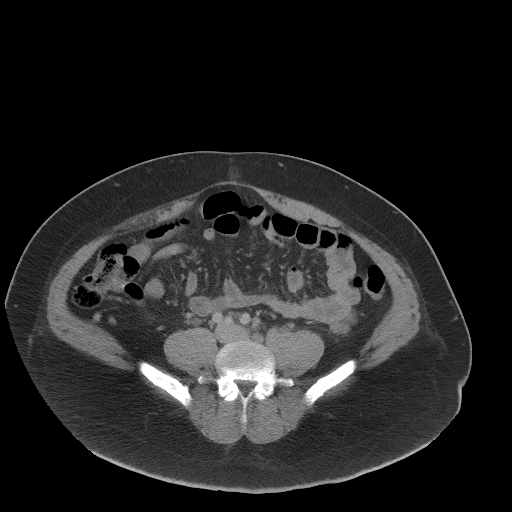
[im 54/103  soft-tissue]
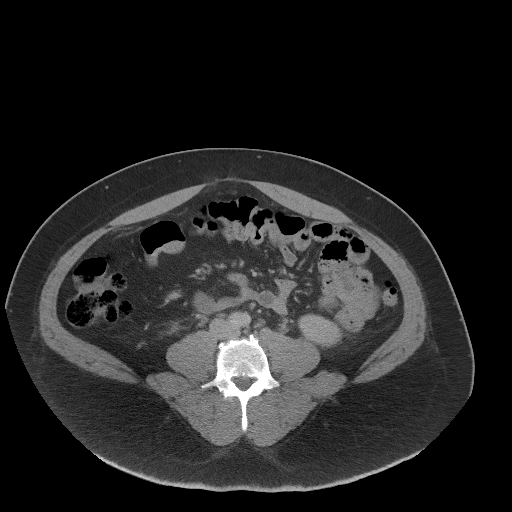
[im 65/103  soft-tissue]
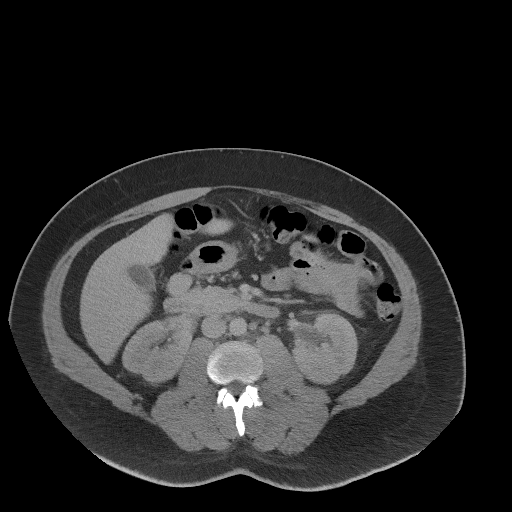
[im 70/103  soft-tissue]
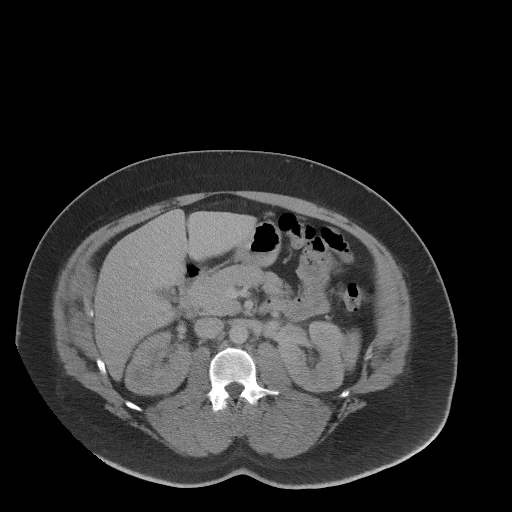
[im 70/103  bone]
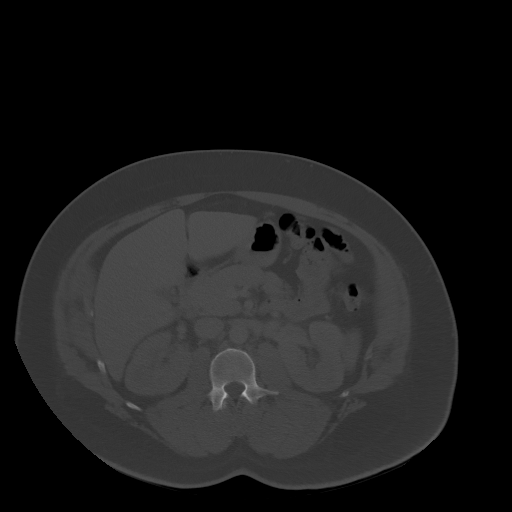
[im 81/103  soft-tissue]
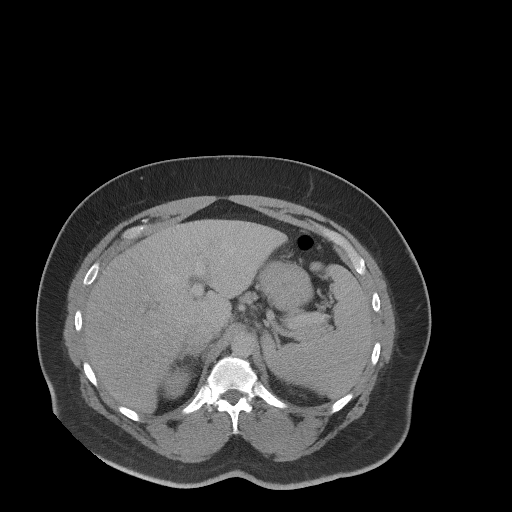
[im 86/103  soft-tissue]
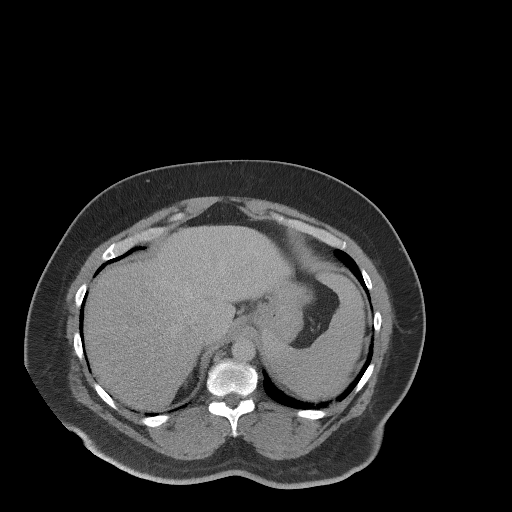
[im 97/103  soft-tissue]
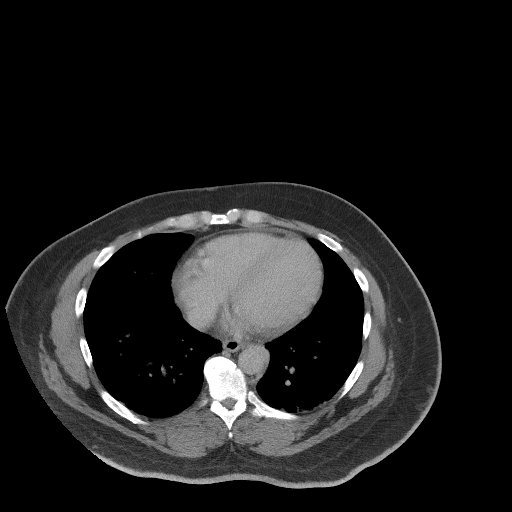

[Series 5: coronal st · coronal · 0.91mm/px · 3 of 121 slices shown]
[im 41/121  soft-tissue]
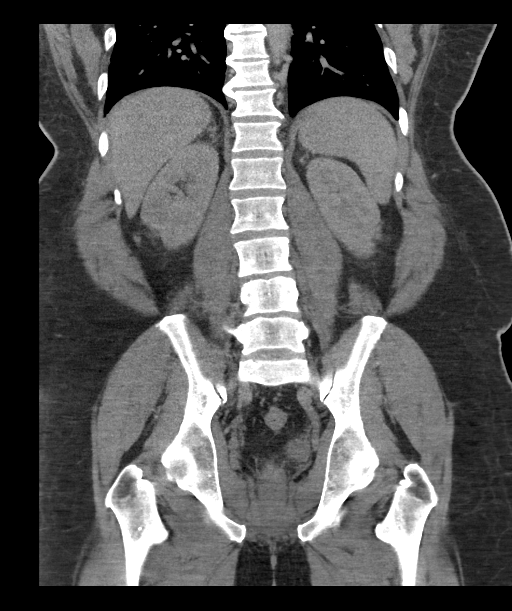
[im 54/121  soft-tissue]
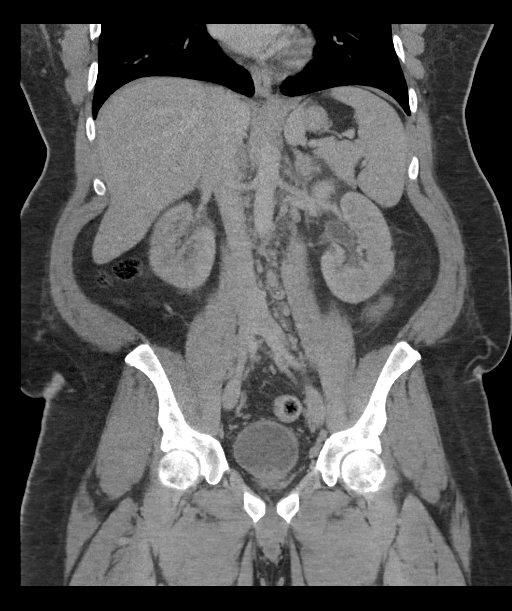
[im 67/121  soft-tissue]
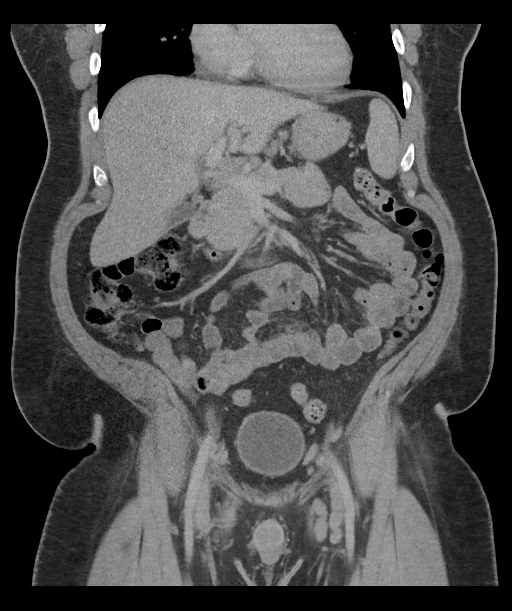

[15 of 46 positions shown; findings below may reference images not displayed]

RADIATION DOSE REDUCTION: This exam was performed according to the
departmental dose-optimization program which includes automated
exposure control, adjustment of the mA and/or kV according to
patient size and/or use of iterative reconstruction technique.

CONTRAST:  100mL OMNIPAQUE IOHEXOL 300 MG/ML  SOLN
FINDINGS: Lower chest: Mild dependent subsegmental atelectasis in both lower
lobes.

Hepatobiliary: Mildly contracted gallbladder. Otherwise
unremarkable.

Pancreas: Unremarkable

Spleen: Spleen measures 12.6 by 6.8 by 10.6 cm (volume = 480
cm^3), compatible with mild splenomegaly. Mildly reduced from
prior measurement of 530 cc. No focal splenic lesion identified.

Adrenals/Urinary Tract: Stable 10 mm nonspecific hypodense lesion in
the right kidney upper pole on image 31 series 2 and stable
nonspecific subcentimeter hypodense lesions of the left kidney.
Trace nonspecific perirenal stranding on the right. No
hydronephrosis or hydroureter. No definite urinary tract calculus.
Urinary bladder unremarkable. The adrenal glands appear
unremarkable.

Stomach/Bowel: Unremarkable

Vascular/Lymphatic: Scattered small retroperitoneal lymph nodes and
subtle retroperitoneal stranding which is nonspecific. 1.2 cm
peripancreatic lymph node on image 28 series 2, stable. Right
external iliac node 1.3 cm in short axis on image 81 series 2,
mildly prominent and previously 1.5 cm on 05/04/2021. Clustered
inguinal lymph nodes including a 1.2 cm left inguinal lymph node on
image 96 series 2, formerly 1.5 cm. The vertical level of the larger
left inferior inguinal lymph node shown on 05/04/2021 was not
included on today's exam.

Reproductive: Unremarkable

Other: No supplemental non-categorized findings.

Musculoskeletal: Small supraumbilical hernias contain adipose
tissue. Lower thoracic spondylosis. Mild degenerative arthropathy of
both hips.
IMPRESSION: 1. Mild splenomegaly, improved from prior. Stable mildly prominent
peripancreatic lymph node and mild reduction in size of the mildly
prominent right external iliac lymph node.
2. No specific bowel abnormality is currently identified.
3. Trace right perirenal stranding. Small hypodense renal lesions
are likely benign. Correlate with urine analysis in assessing
whether any further renal workup is indicated.
4. Small supraumbilical hernias contain adipose tissue.

## 2022-12-06 IMAGING — CT CT ABD-PELV W/ CM
2 of 4 series · 16 of 46 positions shown, 18 images · IV contrast (Omnipaque)
Comparison: Prior CT scan 10/02/2021 and 09/04/2020

CLINICAL DATA: Periumbilical abdominal pain for 1 month.  Diarrhea.

EXAM:
CT ABDOMEN AND PELVIS WITH CONTRAST
TECHNIQUE: Multidetector CT imaging of the abdomen and pelvis was performed
using the standard protocol following bolus administration of
intravenous contrast.

[Series 2: axial st · axial · 0.98mm/px · z∈[-472,-8]mm · 13 of 101 slices shown, 15 images]
[im 4/101  soft-tissue]
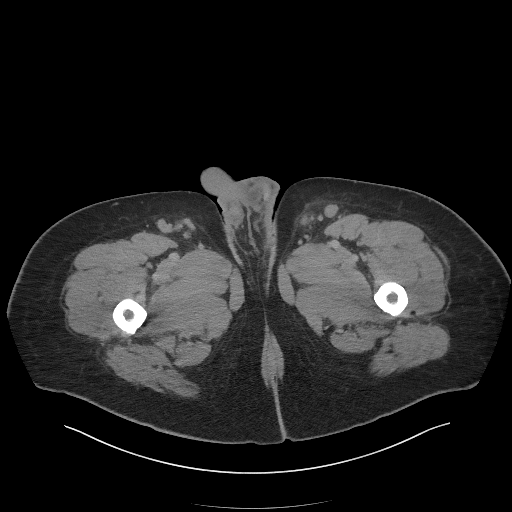
[im 4/101  bone]
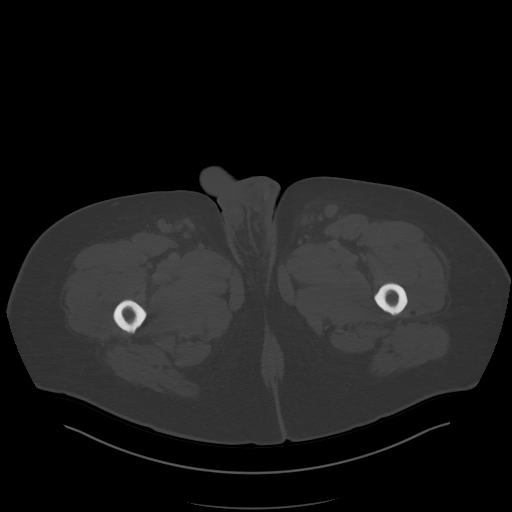
[im 12/101  soft-tissue]
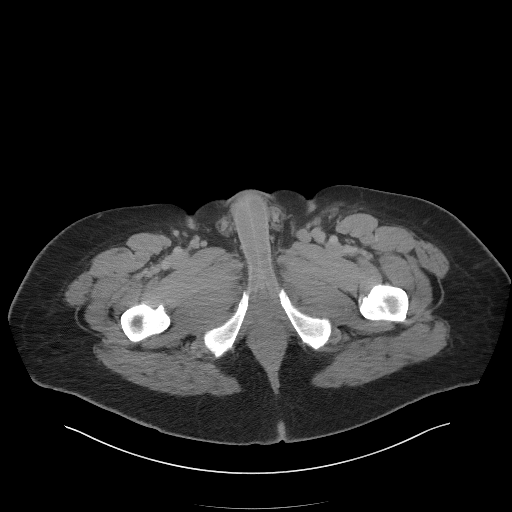
[im 20/101  soft-tissue]
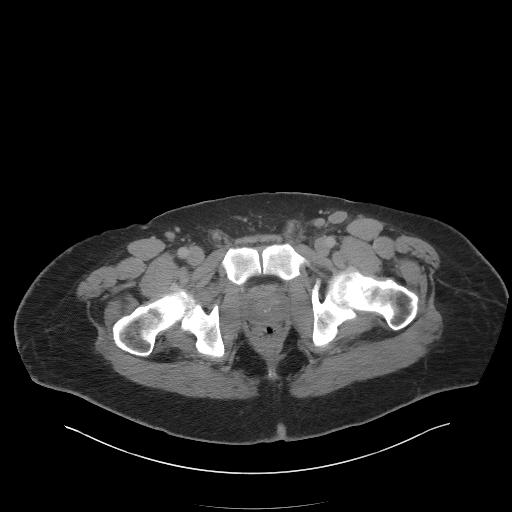
[im 27/101  soft-tissue]
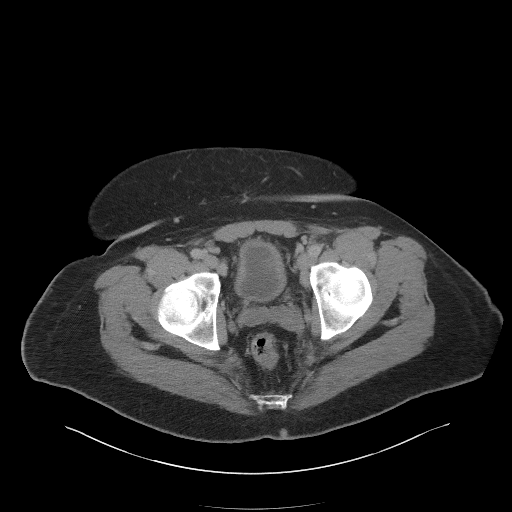
[im 35/101  soft-tissue]
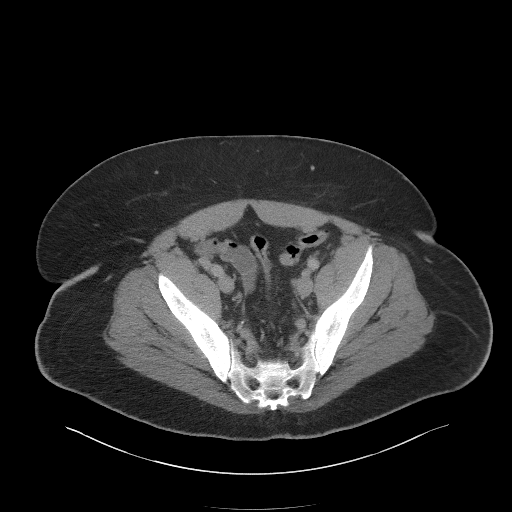
[im 43/101  soft-tissue]
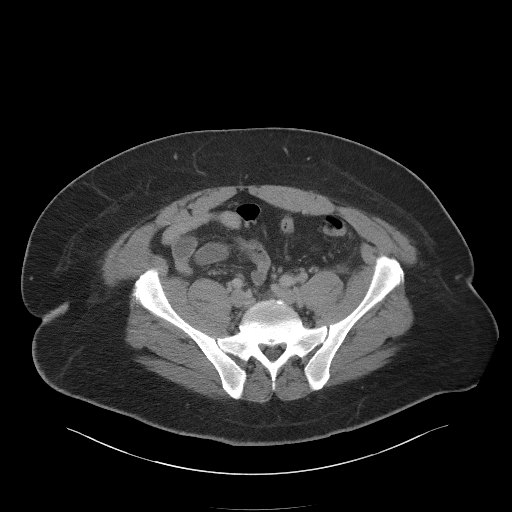
[im 51/101  soft-tissue]
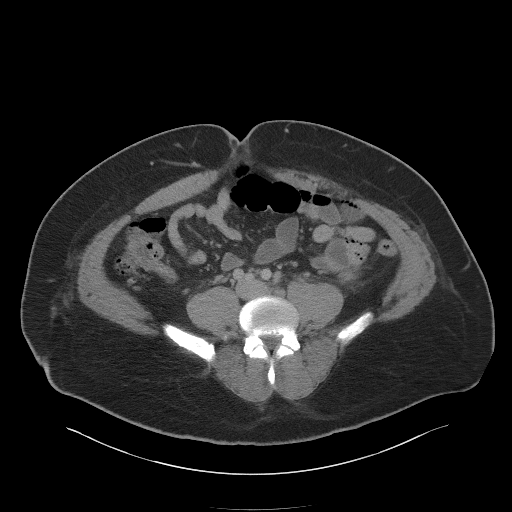
[im 58/101  soft-tissue]
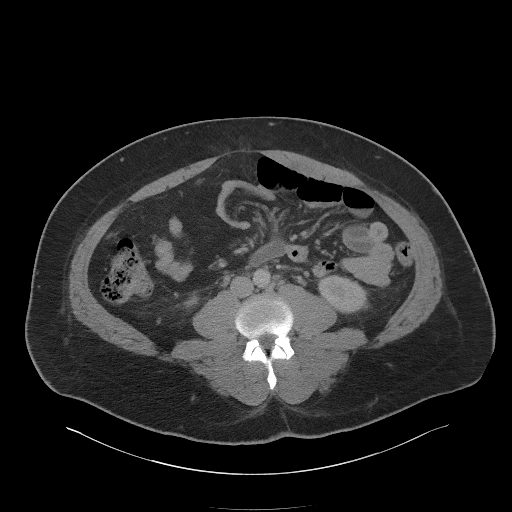
[im 66/101  soft-tissue]
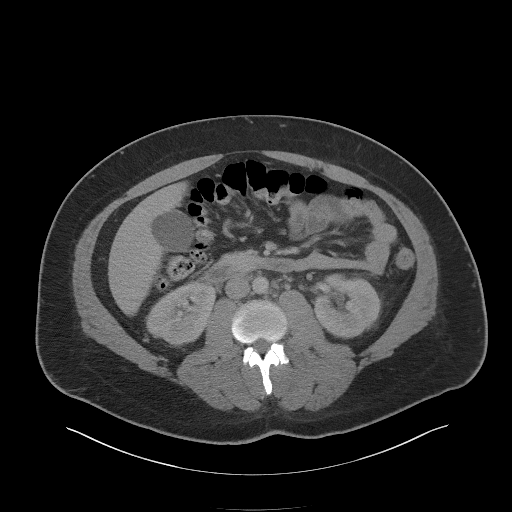
[im 66/101  bone]
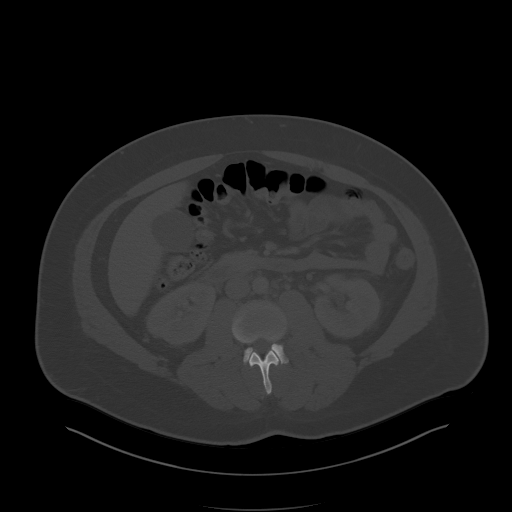
[im 74/101  soft-tissue]
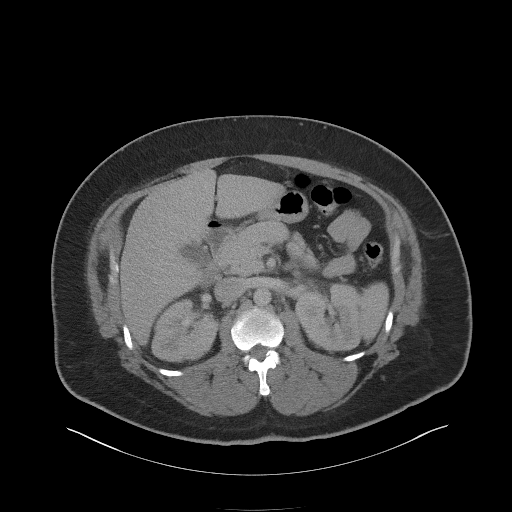
[im 81/101  soft-tissue]
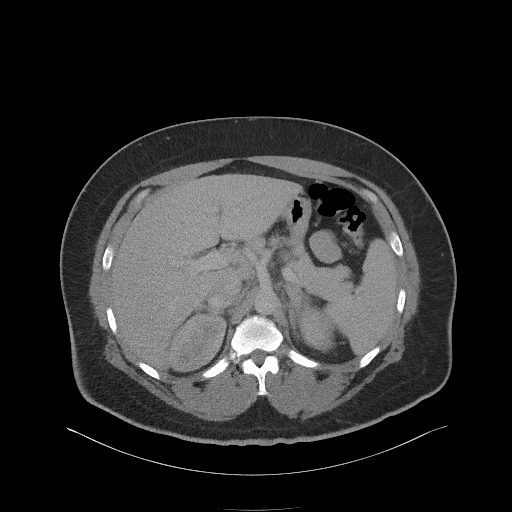
[im 89/101  soft-tissue]
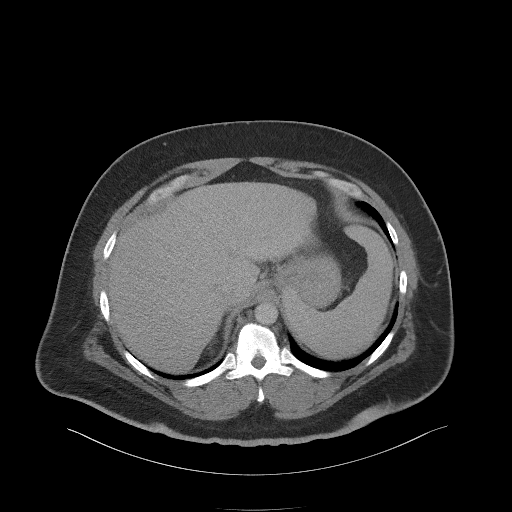
[im 97/101  soft-tissue]
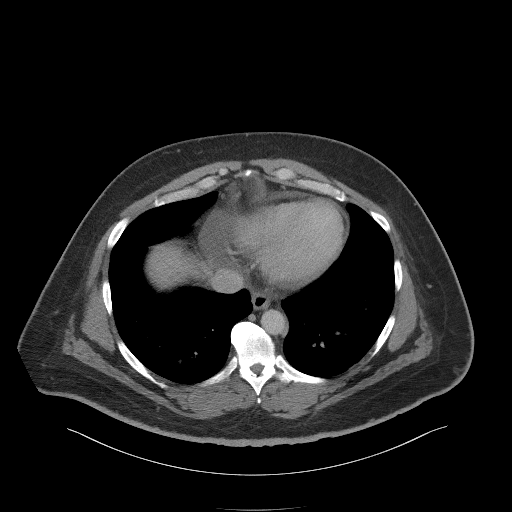

[Series 5: coronal st · coronal · 0.93mm/px · 3 of 109 slices shown]
[im 37/109  soft-tissue]
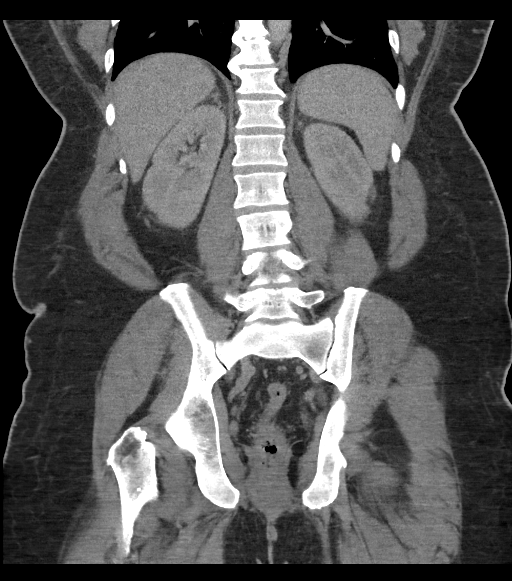
[im 49/109  soft-tissue]
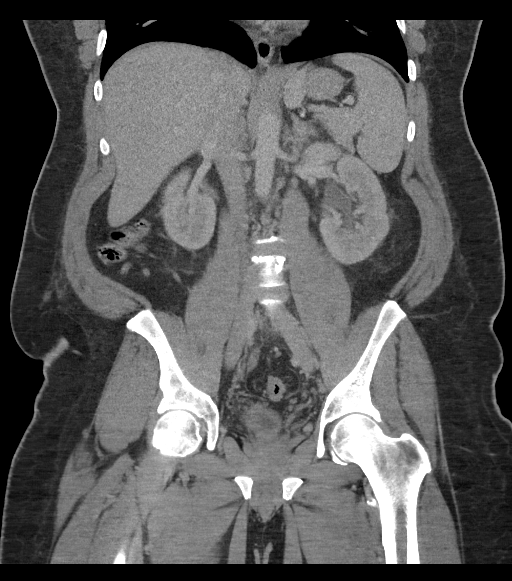
[im 61/109  soft-tissue]
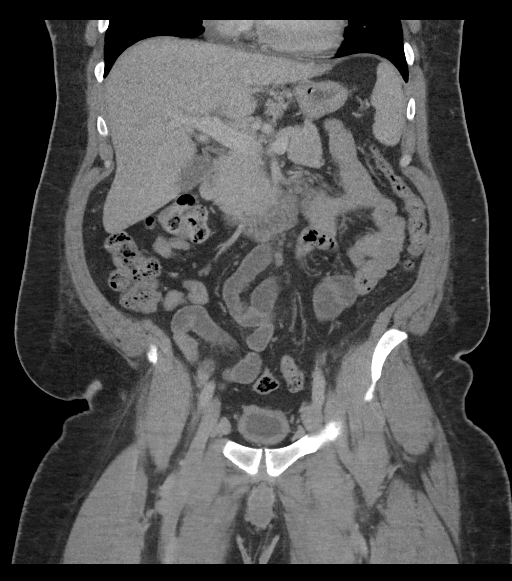

[16 of 46 positions shown; findings below may reference images not displayed]

RADIATION DOSE REDUCTION: This exam was performed according to the
departmental dose-optimization program which includes automated
exposure control, adjustment of the mA and/or kV according to
patient size and/or use of iterative reconstruction technique.

CONTRAST:  100mL OMNIPAQUE IOHEXOL 300 MG/ML  SOLN
FINDINGS: Lower chest: The lung bases are clear of acute process. No pleural
effusion or pulmonary lesions. The heart is normal in size. Small
pericardial effusion is noted. The distal esophagus and aorta are
unremarkable.

Hepatobiliary: No hepatic lesions are identified. No intrahepatic
biliary dilatation. The gallbladder is unremarkable. No common bile
duct dilatation.

Pancreas: No mass, inflammation or ductal dilatation.

Spleen: Stable borderline splenomegaly. Spleen measures 14 x 11 x 11
cm. No splenic lesions.

Adrenals/Urinary Tract: The adrenal glands are unremarkable. Stable
small renal cysts. No worrisome renal lesions or
hydroureteronephrosis. Mild uniform bladder wall thickening likely
due to lack of distension.

Stomach/Bowel: The stomach, duodenum small and colon are grossly
normal. No acute inflammatory process, mass lesions or obstructive
findings. The terminal ileum and appendix are normal.

Vascular/Lymphatic: The aorta and branch vessels are patent. No
aneurysm or dissection. The major venous structures are patent.

Borderline celiac axis, retroperitoneal, pelvic and inguinal lymph
nodes slightly progressive when compared to prior study from 3133.
Could not exclude a low grade lymphoproliferative process. Recommend
clinical correlation. Lymph node biopsy may be indicated.

Reproductive: Mild prostate gland enlargement. The seminal vesicles
are unremarkable.

Other: No ascites abdominal hernia.  No inguinal hernia.

Musculoskeletal: No significant bony findings.
IMPRESSION: 1. No acute abdominal/pelvic findings or mass lesions.
2. Borderline celiac axis, retroperitoneal, pelvic and inguinal
lymph nodes slightly progressive when compared to prior CT from
3133. This in conjunction with borderline splenomegaly could suggest
a low grade smoldering lymphoproliferative process. Lymph node
biopsy may be indicated.
3. Small pericardial effusion.
